# Patient Record
Sex: Female | Born: 1985 | Race: Black or African American | Hispanic: No | Marital: Single | Smoking: Never smoker
Health system: Southern US, Community
[De-identification: ages and names within clinical notes are randomized; demographics above are authoritative.]

## PROBLEM LIST (undated history)

## (undated) DIAGNOSIS — Z803 Family history of malignant neoplasm of breast: Secondary | ICD-10-CM

## (undated) DIAGNOSIS — Z807 Family history of other malignant neoplasms of lymphoid, hematopoietic and related tissues: Secondary | ICD-10-CM

## (undated) DIAGNOSIS — F99 Mental disorder, not otherwise specified: Secondary | ICD-10-CM

## (undated) DIAGNOSIS — B009 Herpesviral infection, unspecified: Secondary | ICD-10-CM

## (undated) DIAGNOSIS — Z808 Family history of malignant neoplasm of other organs or systems: Secondary | ICD-10-CM

## (undated) HISTORY — PX: GALLBLADDER SURGERY: SHX652

## (undated) HISTORY — DX: Family history of malignant neoplasm of breast: Z80.3

## (undated) HISTORY — DX: Mental disorder, not otherwise specified: F99

## (undated) HISTORY — DX: Family history of malignant neoplasm of other organs or systems: Z80.8

## (undated) HISTORY — DX: Family history of other malignant neoplasms of lymphoid, hematopoietic and related tissues: Z80.7

## (undated) HISTORY — PX: WISDOM TOOTH EXTRACTION: SHX21

## (undated) HISTORY — DX: Herpesviral infection, unspecified: B00.9

---

## 2010-07-03 ENCOUNTER — Encounter: Payer: Self-pay | Admitting: Obstetrics and Gynecology

## 2010-11-21 ENCOUNTER — Inpatient Hospital Stay: Payer: Self-pay | Admitting: Obstetrics and Gynecology

## 2013-01-15 ENCOUNTER — Encounter: Payer: Self-pay | Admitting: Maternal and Fetal Medicine

## 2013-06-06 ENCOUNTER — Observation Stay: Payer: Self-pay | Admitting: Obstetrics & Gynecology

## 2015-11-25 DIAGNOSIS — Z8619 Personal history of other infectious and parasitic diseases: Secondary | ICD-10-CM | POA: Insufficient documentation

## 2017-11-19 ENCOUNTER — Emergency Department
Admission: EM | Admit: 2017-11-19 | Discharge: 2017-11-19 | Disposition: A | Discharge status: Home / Self Care | Payer: Self-pay | Attending: Emergency Medicine | Admitting: Emergency Medicine

## 2017-11-19 ENCOUNTER — Other Ambulatory Visit: Payer: Self-pay

## 2017-11-19 ENCOUNTER — Encounter: Payer: Self-pay | Admitting: Emergency Medicine

## 2017-11-19 DIAGNOSIS — Y939 Activity, unspecified: Secondary | ICD-10-CM | POA: Insufficient documentation

## 2017-11-19 DIAGNOSIS — S61412A Laceration without foreign body of left hand, initial encounter: Secondary | ICD-10-CM | POA: Insufficient documentation

## 2017-11-19 DIAGNOSIS — Y999 Unspecified external cause status: Secondary | ICD-10-CM | POA: Insufficient documentation

## 2017-11-19 DIAGNOSIS — Y92090 Kitchen in other non-institutional residence as the place of occurrence of the external cause: Secondary | ICD-10-CM | POA: Insufficient documentation

## 2017-11-19 DIAGNOSIS — W260XXA Contact with knife, initial encounter: Secondary | ICD-10-CM | POA: Insufficient documentation

## 2017-11-19 MED ORDER — LIDOCAINE HCL (PF) 1 % IJ SOLN
5.0000 mL | Freq: Once | INTRAMUSCULAR | Status: DC
  Filled 2017-11-19: qty 5

## 2017-11-19 NOTE — ED Triage Notes (Signed)
Pt with small laceration to left hand with a knife prior to arrival.

## 2017-11-19 NOTE — ED Provider Notes (Signed)
[facility] Emergency Department Provider Note  ____________________________________________   First MD Initiated Contact with Patient 11/19/17 1005     (approximate)  I have reviewed the triage vital signs and the nursing notes.   HISTORY  Chief Complaint Laceration    HPI Adrienne Hall is a 31 y.o. female presents to the emergency room with a laceration to her left hand. Patient states that she was using a knife in the kitchen to cut zip ties when she accidentally cut herself. patient states she is up-to-date on immunizations. He denies any paresthesias to her digits. No foreign body.  History reviewed. No pertinent past medical history.  There are no active problems to display for this patient.   History reviewed. No pertinent surgical history.  Prior to Admission medications   Not on File    Allergies Penicillins  No family history on file.  Social History Social History   Tobacco Use  . Smoking status: Never Smoker  . Smokeless tobacco: Never Used  Substance Use Topics  . Alcohol use: No    Frequency: Never  . Drug use: No    Review of Systems Constitutional: No fever/chills Cardiovascular: Denies chest pain. Respiratory: Denies shortness of breath. Gastrointestinal:  No nausea, no vomiting. Musculoskeletal: negative for left hand pain. Skin: positive for laceration left hand. Neurological: Negative for focal weakness or numbness. ____________________________________________   PHYSICAL EXAM:  VITAL SIGNS: ED Triage Vitals [11/19/17 0907]  Enc Vitals Group     BP 114/68     Pulse Rate 64     Resp 18     Temp 98.5 F (36.9 C)     Temp Source Oral     SpO2 100 %     Weight 160 lb (72.6 kg)     Height 5\' 6"  (1.676 m)     Head Circumference      Peak Flow      Pain Score      Pain Loc      Pain Edu?      Excl. in [place]?    Constitutional: Alert and oriented. Well appearing and in no acute distress. Eyes:  Conjunctivae are normal.  Head: Atraumatic. Neck: No stridor.   Cardiovascular: Normal rate, regular rhythm. Grossly normal heart sounds.  Good peripheral circulation. Respiratory: Normal respiratory effort.  No retractions. Lungs CTAB. Musculoskeletal: exam of left hand there is a superficial laceration noted in the webspace between the first and second digit. There is no active bleeding present. No foreign body noted. The area measures approximately 2 cm. Motor sensory function intact. Neurologic:  Normal speech and language. No gross focal neurologic deficits are appreciated. No gait instability. Skin:  Skin is warm, dry and intact. No rash noted. Psychiatric: Mood and affect are normal. Speech and behavior are normal.  ____________________________________________   LABS (all labs ordered are listed, but only abnormal results are displayed)  Labs Reviewed - No data to display  PROCEDURES  Procedure(s) performed: LACERATION REPAIR Performed by: Johnn Hai Authorized by: Johnn Hai Consent: Verbal consent obtained. Risks and benefits: risks, benefits and alternatives were discussed Consent given by: patient Patient identity confirmed: provided demographic data Prepped and Draped in normal sterile fashion Wound explored  Laceration Location: left hand, web space between first and second digit.  Laceration Length: 2.0cm  No Foreign Bodies seen or palpated  Anesthesia: local infiltration  Local anesthetic: lidocaine 1% without epinephrine  Anesthetic total: 2.0 ml  Irrigation method: syringe Amount  of cleaning: standard  Skin closure: 5-0 Ethilon  Number of sutures:2  Technique: simple interrupted  Patient tolerance: Patient tolerated the procedure well with no immediate complications.  Procedures  Critical Care performed: No  ____________________________________________   INITIAL IMPRESSION / ASSESSMENT AND PLAN / ED COURSE Patient is to take  Tylenol as needed for pain. She is given instructions on care of her laceration. She is return to the ED or go to an urgent care for suture removal in approximately 10-12 days. She was given instructions on watching for signs of infection.  ____________________________________________   FINAL CLINICAL IMPRESSION(S) / ED DIAGNOSES  Final diagnoses:  Laceration of left hand without foreign body, initial encounter     ED Discharge Orders    None       Note:  This document was prepared using Dragon voice recognition software and may include unintentional dictation errors.    Johnn Hai, PA-C 11/19/17 1327    Schuyler Amor, MD 11/19/17 [phone]

## 2017-11-19 NOTE — Discharge Instructions (Signed)
Keeping clean and dry. Clean daily with mild soap and water. Tylenol as needed for pain. Return to the ED or your primary care provider if any signs of infection. Have sutures removed either at your primary care, urgent care, or emergency department in 12 days.

## 2017-11-19 NOTE — ED Notes (Signed)
See triage note  States she was making lunches this am and stabbed herself in web space to left hand  Bleeding controlled

## 2019-01-09 LAB — HM HIV SCREENING LAB: HM HIV Screening: NEGATIVE

## 2019-01-09 LAB — HM PAP SMEAR: HM Pap smear: NEGATIVE

## 2019-04-01 DIAGNOSIS — F418 Other specified anxiety disorders: Secondary | ICD-10-CM | POA: Insufficient documentation

## 2019-06-30 ENCOUNTER — Telehealth: Payer: Self-pay | Admitting: Family Medicine

## 2019-06-30 NOTE — Telephone Encounter (Signed)
Needs nexplanon removed and reinserted

## 2019-07-01 NOTE — Telephone Encounter (Signed)
TC with patient.  Nexplanon appt scheduled for 07/16/19 Aileen Fass, RN

## 2019-07-01 NOTE — Telephone Encounter (Signed)
Nexplanon consult completed. Aileen Fass, RN

## 2019-07-14 DIAGNOSIS — F418 Other specified anxiety disorders: Secondary | ICD-10-CM

## 2019-07-14 DIAGNOSIS — Z8619 Personal history of other infectious and parasitic diseases: Secondary | ICD-10-CM

## 2019-07-16 ENCOUNTER — Ambulatory Visit: Payer: Medicaid Other

## 2019-07-16 ENCOUNTER — Ambulatory Visit (LOCAL_COMMUNITY_HEALTH_CENTER): Payer: Medicaid Other | Admitting: Physician Assistant

## 2019-07-16 ENCOUNTER — Ambulatory Visit: Payer: Self-pay

## 2019-07-16 ENCOUNTER — Other Ambulatory Visit: Payer: Self-pay

## 2019-07-16 VITALS — BP 116/76 | Ht 66.0 in | Wt 188.8 lb

## 2019-07-16 DIAGNOSIS — Z3009 Encounter for other general counseling and advice on contraception: Secondary | ICD-10-CM | POA: Diagnosis not present

## 2019-07-16 DIAGNOSIS — Z113 Encounter for screening for infections with a predominantly sexual mode of transmission: Secondary | ICD-10-CM

## 2019-07-16 LAB — WET PREP FOR TRICH, YEAST, CLUE
Trichomonas Exam: NEGATIVE
Yeast Exam: NEGATIVE

## 2019-07-16 NOTE — Progress Notes (Signed)
Here today for Nexplanon removal/reinsertion and STD testing. Adrienne Morales, RN

## 2019-07-17 ENCOUNTER — Ambulatory Visit: Payer: Self-pay

## 2019-07-17 NOTE — Progress Notes (Signed)
Family Planning Visit- Repeat Yearly Visit  Subjective:  Adrienne Hall is a 33 y.o. being seen today for an well woman visit and to discuss family planning options.    She is currently using Nexplanon for pregnancy prevention. Patient reports she does not  want a pregnancy in the next year. Patient  has Depression with anxiety and History of herpes genitalis on their problem list.  Chief Complaint  Patient presents with  . Contraception    Nexplanon removal/reinsertion    Patient reports that she has no symptoms but still would like STD testing.  Per review of [facility] chart, patient had current Nexplanon placed on 07/03/2017, so it is not due for removal until July of 2021.  Patient denies any symptoms or concerns today.    Does the patient desire a pregnancy in the next year? (OKQ flowsheet)  See flowsheet for other program required questions.   Body mass index is 30.47 kg/m. - Patient is eligible for diabetes screening based on BMI and age >71?  not applicable GG2I ordered? not applicable  Patient reports 1 of partners in last year. Desires STI screening?  Yes  Does the patient have a current or past history of drug use? No   No components found for: HCV]   Health Maintenance Due  Topic Date Due  . TETANUS/TDAP  06/08/2004    Review of Systems  All other systems reviewed and are negative.   The following portions of the patient's history were reviewed and updated as appropriate: allergies, current medications, past family history, past medical history, past social history, past surgical history and problem list. Problem list updated.  Objective:   Vitals:   07/16/19 1553  BP: 116/76  Weight: 188 lb 12.8 oz (85.6 kg)  Height: 5\' 6"  (1.676 m)    Physical Exam Vitals signs reviewed.  Constitutional:      Appearance: Normal appearance.  HENT:     Head: Normocephalic and atraumatic.     Mouth/Throat:     Mouth: Mucous membranes are moist.     Pharynx:  Oropharynx is clear. No oropharyngeal exudate or posterior oropharyngeal erythema.  Neck:     Musculoskeletal: Neck supple.  Pulmonary:     Effort: Pulmonary effort is normal.  Abdominal:     Palpations: Abdomen is soft. There is no mass.     Tenderness: There is no abdominal tenderness. There is no guarding or rebound.  Genitourinary:    General: Normal vulva.     Rectum: Normal.     Comments: External genitalia/pubic area normal female without nits, lice, edema, erythema, lesions or inguinal adenopathy. Vagina with normal mucosa and discharge. Cervix without visible lesions. Uterus normal size, firm, mobile, nt,no masses, no CMT, no adnexal tenderness or fullness.  Lymphadenopathy:     Cervical: No cervical adenopathy.  Skin:    General: Skin is warm and dry.     Findings: No bruising, erythema, lesion or rash.  Neurological:     Mental Status: She is alert and oriented to person, place, and time.  Psychiatric:        Mood and Affect: Mood normal.        Behavior: Behavior normal.        Thought Content: Thought content normal.        Judgment: Judgment normal.       Assessment and Plan:  Adrienne Hall is a 33 y.o. female presenting to the [facility] Department for an initial well woman exam/family  planning visit  Contraception counseling: Reviewed all forms of birth control options available including abstinence; over the counter/barrier methods; hormonal contraceptive medication including pill, patch, ring, injection,contraceptive implant; hormonal and nonhormonal IUDs; permanent sterilization options including vasectomy and the various tubal sterilization modalities. Risks and benefits reviewed.  Questions were answered.  Written information was also given to the patient to review.  Patient desires to continue with Nexplanon, this was prescribed for patient. She will follow up in  3-6 months  for surveillance.  She was told to call with any further questions, or  with any concerns about this method of contraception.  Emphasized use of condoms 100% of the time for STI prevention.  1. Encounter for counseling regarding contraception Counseled that Nexplanon is still good for another year. Rec condoms with all sex for STD protection.  2. Screening for STD (sexually transmitted disease) Patient without symptoms today  Await test results.  Counseled that RN will call if she needs to RTC for any treatment once results are back. - WET PREP FOR [place], [facility], [place] [facility] - Syphilis Serology, [place] [facility]     No follow-ups on file.  No future appointments.  Jerene Dilling, PA

## 2019-09-10 ENCOUNTER — Ambulatory Visit: Payer: Self-pay | Admitting: Physician Assistant

## 2019-09-10 ENCOUNTER — Other Ambulatory Visit: Payer: Self-pay

## 2019-09-10 DIAGNOSIS — Z113 Encounter for screening for infections with a predominantly sexual mode of transmission: Secondary | ICD-10-CM

## 2019-09-11 ENCOUNTER — Encounter: Payer: Self-pay | Admitting: Physician Assistant

## 2019-09-11 LAB — WET PREP FOR TRICH, YEAST, CLUE
Clue Cell Exam: POSITIVE — AB
Trichomonas Exam: NEGATIVE
Yeast Exam: NEGATIVE

## 2019-09-11 NOTE — Progress Notes (Signed)
    STI clinic/screening visit  Subjective:  Adrienne Hall is a 33 y.o. female being seen today for an STI screening visit. The patient reports they do not have symptoms.  Patient has the following medical conditions:   Patient Active Problem List   Diagnosis Date Noted  . Depression with anxiety 04/01/2019  . History of herpes genitalis 11/25/2015     Chief Complaint  Patient presents with  . SEXUALLY TRANSMITTED DISEASE    HPI  Patient reports that she had unprotected sex with a new partner and would like a screening.  Reports that she took prednisone and Azithromycin about 2 weeks ago for strep throat.  No period with the Nexplanon.  Denies any symptoms today.  See flowsheet for further details and programmatic requirements.    The following portions of the patient's history were reviewed and updated as appropriate: allergies, current medications, past medical history, past social history, past surgical history and problem list.  Objective:  There were no vitals filed for this visit.  Physical Exam Constitutional:      General: She is not in acute distress.    Appearance: Normal appearance.  HENT:     Head: Normocephalic and atraumatic.     Mouth/Throat:     Mouth: Mucous membranes are moist.     Pharynx: Oropharynx is clear. No oropharyngeal exudate or posterior oropharyngeal erythema.  Eyes:     Conjunctiva/sclera: Conjunctivae normal.  Neck:     Musculoskeletal: Neck supple.  Pulmonary:     Effort: Pulmonary effort is normal.  Abdominal:     Palpations: Abdomen is soft. There is no mass.     Tenderness: There is no abdominal tenderness. There is no guarding or rebound.  Genitourinary:    General: Normal vulva.     Rectum: Normal.     Comments: External genitalia/pubic area without nits, lice, edema, erythema, lesions and inguinal adenopathy. Vagina with normal mucosa and discharge, pH=4.5. Cervix without visible lesions. Uterus normal size, firm, mobile,  nt, no masses, no CMT, no adnexal tenderness. Lymphadenopathy:     Cervical: No cervical adenopathy.  Skin:    General: Skin is warm and dry.     Findings: No bruising, erythema, lesion or rash.  Neurological:     Mental Status: She is alert and oriented to person, place, and time.  Psychiatric:        Mood and Affect: Mood normal.        Behavior: Behavior normal.        Thought Content: Thought content normal.        Judgment: Judgment normal.       Assessment and Plan:  Adrienne Hall is a 33 y.o. female presenting to the [facility] Department for STI screening  1. Screening for STD (sexually transmitted disease) Patient is without symptoms.  Declines blood work today. Rec condoms with all sex Await test results.  Counseled that RN will call if needs to RTC for any treatment once results are back.  - WET PREP FOR [place], [facility], [place] [facility]     No follow-ups on file.  No future appointments.  Jerene Dilling, PA

## 2019-11-01 ENCOUNTER — Emergency Department
Admission: EM | Admit: 2019-11-01 | Discharge: 2019-11-01 | Disposition: A | Discharge status: Home / Self Care | Payer: Self-pay | Attending: Student | Admitting: Student

## 2019-11-01 ENCOUNTER — Encounter: Payer: Self-pay | Admitting: Intensive Care

## 2019-11-01 ENCOUNTER — Other Ambulatory Visit: Payer: Self-pay

## 2019-11-01 DIAGNOSIS — T1582XA Foreign body in other and multiple parts of external eye, left eye, initial encounter: Secondary | ICD-10-CM | POA: Insufficient documentation

## 2019-11-01 DIAGNOSIS — X58XXXA Exposure to other specified factors, initial encounter: Secondary | ICD-10-CM | POA: Insufficient documentation

## 2019-11-01 DIAGNOSIS — Y9389 Activity, other specified: Secondary | ICD-10-CM | POA: Insufficient documentation

## 2019-11-01 DIAGNOSIS — T1592XA Foreign body on external eye, part unspecified, left eye, initial encounter: Secondary | ICD-10-CM

## 2019-11-01 DIAGNOSIS — Y998 Other external cause status: Secondary | ICD-10-CM | POA: Insufficient documentation

## 2019-11-01 DIAGNOSIS — Z79899 Other long term (current) drug therapy: Secondary | ICD-10-CM | POA: Insufficient documentation

## 2019-11-01 DIAGNOSIS — Y929 Unspecified place or not applicable: Secondary | ICD-10-CM | POA: Insufficient documentation

## 2019-11-01 MED ORDER — FLUORESCEIN SODIUM 1 MG OP STRP
1.0000 | ORAL_STRIP | Freq: Once | OPHTHALMIC | Status: AC
  Administered 2019-11-01: 14:00:00 1 via OPHTHALMIC
  Filled 2019-11-01: qty 1

## 2019-11-01 MED ORDER — ERYTHROMYCIN 5 MG/GM OP OINT: 1.0000 "application " | TOPICAL_OINTMENT | Freq: Four times a day (QID) | OPHTHALMIC | 0 refills | Status: DC

## 2019-11-01 MED ORDER — TETRACAINE HCL 0.5 % OP SOLN
2.0000 [drp] | Freq: Once | OPHTHALMIC | Status: AC
  Administered 2019-11-01: 14:00:00 2 [drp] via OPHTHALMIC
  Filled 2019-11-01: qty 4

## 2019-11-01 NOTE — ED Triage Notes (Signed)
Patient accidentally put a drop of nail glue in her left eye thinking it was eye drops.

## 2019-11-01 NOTE — ED Provider Notes (Signed)
[facility] Emergency Department Provider Note  ____________________________________________  Time seen: Approximately 1:57 PM  I have reviewed the triage vital signs and the nursing notes.   HISTORY  Chief Complaint Foreign Body in Eye (left)    HPI Adrienne Hall is a 33 y.o. female presents emergency department for evaluation after placing nail glue to her left eye today.  Patient thought the nail glue was eyedrops.  She uses eyedrops for allergies.  She does not wear any contacts.  Patient has removed most of the glue prior to coming to the emergency department.  She states that she removed most of her eyelashes in the process of pulling off the glue.  She is now able to open her eye.  She does have a scratchy feeling to her eye.  She is not having any difficulty seeing.   History reviewed. No pertinent past medical history.  Patient Active Problem List   Diagnosis Date Noted  . Depression with anxiety 04/01/2019  . History of herpes genitalis 11/25/2015    Past Surgical History:  Procedure Laterality Date  . CESAREAN SECTION      Prior to Admission medications   Medication Sig Start Date End Date Taking? Authorizing Provider  erythromycin ophthalmic ointment Place 1 application into the right eye 4 (four) times daily. 11/01/19   Laban Emperor, PA-C  etonogestrel (NEXPLANON) 68 MG IMPL implant 1 each by Subdermal route once. 07/03/17   Willaim Sheng, NP  valACYclovir (VALTREX) 500 MG tablet TAKE 1 CAPLET BY MOUTH ONCE DAILY 01/21/19   [provider]    Allergies Penicillins  Family History  Problem Relation Age of Onset  . Diabetes Maternal Grandmother   . Hypertension Maternal Grandmother   . Hyperlipidemia Maternal Grandmother   . Alzheimer's disease Maternal Grandmother   . Heart disease Maternal Grandfather   . Hypertension Maternal Grandfather   . Breast cancer Paternal Grandmother   . Kidney disease Paternal Grandfather    . Sickle cell anemia Half-Brother   . Migraines Half-Brother   . Cancer Half-Brother     Social History Social History   Tobacco Use  . Smoking status: Never Smoker  . Smokeless tobacco: Never Used  Substance Use Topics  . Alcohol use: Yes    Alcohol/week: 1.0 standard drinks    Types: 1 Glasses of wine per week    Frequency: Never  . Drug use: No     Review of Systems  Gastrointestinal: No nausea, no vomiting.  Musculoskeletal: Negative for musculoskeletal pain. Skin: Negative for rash, abrasions, lacerations, ecchymosis. Neurological: Negative for headaches   ____________________________________________   PHYSICAL EXAM:  VITAL SIGNS: ED Triage Vitals  Enc Vitals Group     BP 11/01/19 1305 (!) 121/95     Pulse Rate 11/01/19 1305 73     Resp 11/01/19 1305 14     Temp 11/01/19 1305 98.4 F (36.9 C)     Temp Source 11/01/19 1305 Oral     SpO2 11/01/19 1305 100 %     Weight 11/01/19 1306 180 lb (81.6 kg)     Height 11/01/19 1306 5\' 6"  (1.676 m)     Head Circumference --      Peak Flow --      Pain Score 11/01/19 1306 7     Pain Loc --      Pain Edu? --      Excl. in [place]? --      Constitutional: Alert and oriented. Well appearing and  in no acute distress. Eyes: Left conjunctiva is injected. PERRL. EOMI. Minimal glue to left upper eyelid.  Almost all eyelashes have been removed.  No defect fluorescein stain Head: Atraumatic. ENT:      Ears:      Nose: No congestion/rhinnorhea.      Mouth/Throat: Mucous membranes are moist.  Neck: No stridor.  Cardiovascular: Normal rate, regular rhythm.  Good peripheral circulation. Respiratory: Normal respiratory effort without tachypnea or retractions. Lungs CTAB. Good air entry to the bases with no decreased or absent breath sounds. Musculoskeletal: Full range of motion to all extremities. No gross deformities appreciated. Neurologic:  Normal speech and language. No gross focal neurologic deficits are appreciated.   Skin:  Skin is warm, dry and intact. No rash noted. Psychiatric: Mood and affect are normal. Speech and behavior are normal. Patient exhibits appropriate insight and judgement.   ____________________________________________   LABS (all labs ordered are listed, but only abnormal results are displayed)  Labs Reviewed - No data to display ____________________________________________  EKG   ____________________________________________  RADIOLOGY   No results found.  ____________________________________________    PROCEDURES  Procedure(s) performed:    Procedures    Medications  tetracaine (PONTOCAINE) 0.5 % ophthalmic solution 2 drop (2 drops Left Eye Given 11/01/19 1409)  fluorescein ophthalmic strip 1 strip (1 strip Left Eye Given 11/01/19 1409)     ____________________________________________   INITIAL IMPRESSION / ASSESSMENT AND PLAN / ED COURSE  Pertinent labs & imaging results that were available during my care of the patient were reviewed by me and considered in my medical decision making (see chart for details).  Review of the [place] CSRS was performed in accordance of the [place] prior to dispensing any controlled drugs.     Patient presented to the emergency department for evaluation of nail glue to left eye.  Vital signs and exam are reassuring.  Patient has removed almost all of the nail glue prior to coming to the emergency department.  No visible defect on fluorescein stain.  Patient is not having any difficulty seeing.  Patient will be discharged home with prescriptions for erythromycin ointment. Patient is to follow up with ophthalmology as directed. Patient is given ED precautions to return to the ED for any worsening or new symptoms.  LYRICK VALIS was evaluated in Emergency Department on 11/01/2019 for the symptoms described in the history of present illness. She was evaluated in the context of the global COVID-19 pandemic, which necessitated  consideration that the patient might be at risk for infection with the SARS-CoV-2 virus that causes COVID-19. Institutional protocols and algorithms that pertain to the evaluation of patients at risk for COVID-19 are in a state of rapid change based on information released by regulatory bodies including the CDC and federal and state organizations. These policies and algorithms were followed during the patient's care in the ED.   ____________________________________________  FINAL CLINICAL IMPRESSION(S) / ED DIAGNOSES  Final diagnoses:  Foreign body of left eye, initial encounter      NEW MEDICATIONS STARTED DURING THIS VISIT:  ED Discharge Orders         Ordered    erythromycin ophthalmic ointment  4 times daily     11/01/19 1358              This chart was dictated using voice recognition software/Dragon. Despite best efforts to proofread, errors can occur which can change the meaning. Any change was purely unintentional.    Laban Emperor, PA-C 11/01/19 1701  Lilia Pro., MD 11/02/19 [phone]

## 2019-11-01 NOTE — Discharge Instructions (Addendum)
Please use erythromycin ointment in case you have injured your cornea getting the glue out. Please follow up with ophthalmology next week.

## 2019-11-01 NOTE — ED Notes (Signed)
Pt accidentally glued left eye shut with nail glue - she thought that the nail glue was eye drops

## 2019-11-01 NOTE — ED Notes (Signed)
Signature pad not working - pt unable to sign for discharge - discussed discharge with pt and she verbalized understanding and need for f/u

## 2019-11-24 ENCOUNTER — Other Ambulatory Visit: Payer: Self-pay

## 2019-11-24 ENCOUNTER — Ambulatory Visit: Payer: Self-pay | Admitting: Family Medicine

## 2019-11-24 ENCOUNTER — Encounter: Payer: Self-pay | Admitting: Family Medicine

## 2019-11-24 DIAGNOSIS — Z113 Encounter for screening for infections with a predominantly sexual mode of transmission: Secondary | ICD-10-CM

## 2019-11-24 NOTE — Progress Notes (Signed)
    STI clinic/screening visit  Subjective:  Adrienne Hall is a 33 y.o. female being seen today for an STI screening visit. The patient reports they do not have symptoms.  Patient reports that they do not desire a pregnancy in the next year.   They reported they are not interested in discussing contraception today.   Patient has the following medical conditions:   Patient Active Problem List   Diagnosis Date Noted  . Depression with anxiety 04/01/2019  . History of herpes genitalis 11/25/2015     No chief complaint on file.   HPI  Patient reports here for STD testing, denies symptoms  See flowsheet for further details and programmatic requirements.    The following portions of the patient's history were reviewed and updated as appropriate: allergies, current medications, past medical history, past social history, past surgical history and problem list.  Objective:  There were no vitals filed for this visit.  Physical Exam Neck:     Musculoskeletal: Neck supple.  Lymphadenopathy:     Cervical: No cervical adenopathy.  Skin:    General: Skin is warm and dry.     Findings: No lesion or rash.  Neurological:     Mental Status: She is alert.   Client declined pelvic exam -self collected the GC/chlamydia   Assessment and Plan:  Adrienne Hall is a 33 y.o. female presenting to the [facility] Department for STI screening  1. Screening examination for venereal disease  - Chlamydia/Gonorrhea [place] [facility] - HIV [place] [facility] - Syphilis Serology, [place] [facility]  Client will be notified if tests results are +   No follow-ups on file.  No future appointments.  Hassell Done, FNP

## 2020-01-14 ENCOUNTER — Other Ambulatory Visit: Payer: Self-pay

## 2020-01-14 ENCOUNTER — Encounter: Payer: Self-pay | Admitting: Physician Assistant

## 2020-01-14 ENCOUNTER — Ambulatory Visit: Payer: Self-pay | Admitting: Physician Assistant

## 2020-01-14 DIAGNOSIS — Z113 Encounter for screening for infections with a predominantly sexual mode of transmission: Secondary | ICD-10-CM

## 2020-01-14 LAB — WET PREP FOR TRICH, YEAST, CLUE
Trichomonas Exam: NEGATIVE
Yeast Exam: NEGATIVE

## 2020-01-14 NOTE — Progress Notes (Signed)
Pt here for STD screening.Ronny Bacon, RN

## 2020-01-16 NOTE — Progress Notes (Signed)
[facility] Department STI clinic/screening visit  Subjective:  Adrienne Hall is a 34 y.o. female being seen today for an STI screening visit. The patient reports they do not have symptoms.  Patient reports that they do not desire a pregnancy in the next year.   They reported they are not interested in discussing contraception today.  No LMP recorded. Patient has had an implant.   Patient has the following medical conditions:   Patient Active Problem List   Diagnosis Date Noted  . Depression with anxiety 04/01/2019  . History of herpes genitalis 11/25/2015    Chief Complaint  Patient presents with  . SEXUALLY TRANSMITTED DISEASE    STD Screening    HPI  Patient reports that she thinks that she had a yeast infection and wants to make sure that it has resolved.  States that she had some itching and used an OTC yeast treatment with the symptoms resolving.  Denies other symptoms.  She states that she has a Nexplanon for [facility].  See flowsheet for further details and programmatic requirements.    The following portions of the patient's history were reviewed and updated as appropriate: allergies, current medications, past medical history, past social history, past surgical history and problem list.  Objective:  There were no vitals filed for this visit.  Physical Exam Constitutional:      General: She is not in acute distress.    Appearance: Normal appearance. She is normal weight.  HENT:     Head: Normocephalic and atraumatic.     Comments: No nits, lice, or hair loss. No cervical, supraclavicular or axillary adenopathy.    Mouth/Throat:     Mouth: Mucous membranes are moist.     Pharynx: Oropharynx is clear. No oropharyngeal exudate or posterior oropharyngeal erythema.  Eyes:     Conjunctiva/sclera: Conjunctivae normal.  Pulmonary:     Effort: Pulmonary effort is normal.  Abdominal:     Palpations: Abdomen is soft. There is no mass.     Tenderness: There is no  abdominal tenderness. There is no guarding or rebound.  Genitourinary:    General: Normal vulva.     Rectum: Normal.     Comments: External genitalia/pubic area without nits, lice, edema, erythema, lesions and inguinal adenopathy. Vagina with normal mucosa and discharge, pH=4.5. Cervix without visible lesions. Uterus firm, mobile, nt, no masses, no CMT, no adnexal tenderness or fullness. Musculoskeletal:     Cervical back: Neck supple. No tenderness.  Skin:    General: Skin is warm and dry.     Findings: No bruising, erythema, lesion or rash.  Neurological:     Mental Status: She is alert and oriented to person, place, and time.  Psychiatric:        Mood and Affect: Mood normal.        Behavior: Behavior normal.        Thought Content: Thought content normal.        Judgment: Judgment normal.      Assessment and Plan:  Adrienne Hall is a 34 y.o. female presenting to the [facility] Department for STI screening  1. Screening for STD (sexually transmitted disease) Patient into clinic without symptoms. Rec condoms with all sex. Await test results.  Counseled that RN will call if needs to RTC for treatment once results are back. - WET PREP FOR [place], YEAST, [place] [facility] - Syphilis Serology, [facility]  No follow-ups on file.  No future appointments.  Jerene Dilling, PA

## 2020-01-21 ENCOUNTER — Telehealth: Payer: Self-pay

## 2020-01-21 DIAGNOSIS — A549 Gonococcal infection, unspecified: Secondary | ICD-10-CM

## 2020-01-22 ENCOUNTER — Ambulatory Visit: Payer: Medicaid Other

## 2020-01-22 DIAGNOSIS — A549 Gonococcal infection, unspecified: Secondary | ICD-10-CM

## 2020-01-22 MED ORDER — GENTAMICIN SULFATE 40 MG/ML IJ SOLN
240.0000 mg | Freq: Once | INTRAMUSCULAR | Status: AC
  Administered 2020-01-22: 15:00:00 240 mg via INTRAMUSCULAR

## 2020-01-22 MED ORDER — AZITHROMYCIN 500 MG PO TABS
2000.0000 mg | ORAL_TABLET | Freq: Once | ORAL | Status: AC
  Administered 2020-01-22: 2000 mg via ORAL

## 2020-01-22 NOTE — Progress Notes (Signed)
PCN allergy; tx'd for GC per SO Aileen Fass, RN

## 2020-01-25 NOTE — Telephone Encounter (Signed)
TC to patient. Verified ID via password/SS#. Informed of positive GC and need for tx. Instructed to eat before visit and have partner call for tx appt. Appt scheduled. Romanda Turrubiates, RN   

## 2020-03-10 ENCOUNTER — Other Ambulatory Visit: Payer: Self-pay

## 2020-03-10 ENCOUNTER — Encounter: Payer: Self-pay | Admitting: Family Medicine

## 2020-03-10 ENCOUNTER — Ambulatory Visit (INDEPENDENT_AMBULATORY_CARE_PROVIDER_SITE_OTHER): Payer: 59 | Admitting: Family Medicine

## 2020-03-10 VITALS — BP 102/78 | HR 72 | Temp 97.4°F | Resp 12 | Ht 65.5 in | Wt 194.2 lb

## 2020-03-10 DIAGNOSIS — R0981 Nasal congestion: Secondary | ICD-10-CM | POA: Insufficient documentation

## 2020-03-10 DIAGNOSIS — Z23 Encounter for immunization: Secondary | ICD-10-CM | POA: Diagnosis not present

## 2020-03-10 DIAGNOSIS — Z8619 Personal history of other infectious and parasitic diseases: Secondary | ICD-10-CM | POA: Diagnosis not present

## 2020-03-10 DIAGNOSIS — H538 Other visual disturbances: Secondary | ICD-10-CM | POA: Insufficient documentation

## 2020-03-10 MED ORDER — VALACYCLOVIR HCL 500 MG PO TABS: 500.0000 mg | ORAL_TABLET | Freq: Every day | ORAL | 3 refills | Status: DC

## 2020-03-10 NOTE — Progress Notes (Signed)
Subjective:     Adrienne Hall is a 34 y.o. female presenting for Establish Care (moved from [place], and then has been going to [facility]), Sinus Problem (nasal congestion-uses a lot of Flonase spray), and Referral (needs to see opthalmologist, glued her eye lids shut around December 2020)     HPI  # Left leg pain - dull ache on a daily basis after getting a shot of antibiotics - the week it happened could barely walk on the leg - needle pain up and down the leg  #Sinus issues - symptoms x 3 years - nose gets stuffy - has tried zyrtec/claritin/allegra, Flonase, xyzal w/o improvement with the congestion - no cough, sneezing  - using decongestant sprays w/ improvement - has tried neti pot w/ improvement - saline rinse helped for a short period of time -- woke up not being able breath  Endorses random nausea - not associated with eating or drinking     #blurry/fuzzy vision - left blurry visiont - glued her eyelids shut in December - did not go to an optometrist - accidentally grabbed nail glue and thought it was eye drops and ended up gluing her eyelid shut - given erythromycin ointment and told to get eyepatch  Review of Systems   Social History   Tobacco Use  Smoking Status Never Smoker  Smokeless Tobacco Never Used        Objective:    BP Readings from Last 3 Encounters:  03/10/20 102/78  11/01/19 (!) 121/95  07/16/19 116/76   Wt Readings from Last 3 Encounters:  03/10/20 194 lb 4 oz (88.1 kg)  11/01/19 180 lb (81.6 kg)  07/16/19 188 lb 12.8 oz (85.6 kg)    BP 102/78   Pulse 72   Temp (!) 97.4 F (36.3 C)   Resp 12   Ht 5' 5.5" (1.664 m)   Wt 194 lb 4 oz (88.1 kg)   SpO2 98%   BMI 31.83 kg/m    Physical Exam Constitutional:      General: She is not in acute distress.    Appearance: She is well-developed. She is not diaphoretic.  HENT:     Head: Normocephalic and atraumatic.     Right Ear: Tympanic membrane and ear canal normal.   Left Ear: Tympanic membrane and ear canal normal.     Nose: Mucosal edema and rhinorrhea present.     Right Sinus: No maxillary sinus tenderness or frontal sinus tenderness.     Left Sinus: No maxillary sinus tenderness or frontal sinus tenderness.     Mouth/Throat:     Pharynx: Uvula midline. Posterior oropharyngeal erythema present. No oropharyngeal exudate.     Tonsils: 0 on the right. 0 on the left.  Eyes:     General: No scleral icterus.    Conjunctiva/sclera: Conjunctivae normal.  Cardiovascular:     Rate and Rhythm: Normal rate and regular rhythm.     Heart sounds: Normal heart sounds. No murmur.  Pulmonary:     Effort: Pulmonary effort is normal. No respiratory distress.     Breath sounds: Normal breath sounds.  Musculoskeletal:     Cervical back: Neck supple.  Lymphadenopathy:     Cervical: No cervical adenopathy.  Skin:    General: Skin is warm and dry.     Capillary Refill: Capillary refill takes less than 2 seconds.  Neurological:     Mental Status: She is alert.           Assessment &  Plan:   Problem List Items Addressed This Visit      Respiratory   Nasal sinus congestion    Stop afrin and other nose spray. Trial of Saline rinse and flonse. If not improved will consider course of abx or ENT referral        Other   History of herpes genitalis    Continue valacyclovir      Relevant Medications   valACYclovir (VALTREX) 500 MG tablet   Blurry vision, left eye - Primary    After exposure to glue. Referral to ophthalmology for evaluation      Relevant Orders   Ambulatory referral to Ophthalmology    Other Visit Diagnoses    Need for immunization against influenza       Relevant Orders   Flu Vaccine QUAD 6+ mos PF IM (Fluarix Quad PF) (Completed)       Return in about 4 weeks (around 04/07/2020).  Lesleigh Noe, MD

## 2020-03-10 NOTE — Assessment & Plan Note (Signed)
After exposure to glue. Referral to ophthalmology for evaluation

## 2020-03-10 NOTE — Assessment & Plan Note (Signed)
Continue valacyclovir 

## 2020-03-10 NOTE — Patient Instructions (Signed)
#  Sinus issues - Try a saline rinse (neti pot) - 1-2 times a day - do this for 2 weeks  - if no improvement could do trial of antibiotics   Can try Flonase or Fluticasone -- nose spray   Vision issues - referral

## 2020-03-10 NOTE — Assessment & Plan Note (Signed)
Stop afrin and other nose spray. Trial of Saline rinse and flonse. If not improved will consider course of abx or ENT referral

## 2020-03-30 ENCOUNTER — Other Ambulatory Visit: Payer: Self-pay

## 2020-03-30 ENCOUNTER — Ambulatory Visit: Payer: Self-pay | Admitting: Physician Assistant

## 2020-03-30 DIAGNOSIS — Z202 Contact with and (suspected) exposure to infections with a predominantly sexual mode of transmission: Secondary | ICD-10-CM

## 2020-03-30 DIAGNOSIS — Z113 Encounter for screening for infections with a predominantly sexual mode of transmission: Secondary | ICD-10-CM

## 2020-03-30 MED ORDER — GENTAMICIN SULFATE 40 MG/ML IJ SOLN
240.0000 mg | Freq: Once | INTRAMUSCULAR | Status: AC
  Administered 2020-03-30: 240 mg via INTRAMUSCULAR

## 2020-03-30 MED ORDER — AZITHROMYCIN 500 MG PO TABS
2000.0000 mg | ORAL_TABLET | Freq: Once | ORAL | Status: AC
  Administered 2020-03-30: 2000 mg via ORAL

## 2020-03-30 NOTE — Progress Notes (Signed)
S:  Patient into clinic requesting treatment as a contact to Gonorrhea. States that she was treated about 1 month ago for the same thing and partner had told her that he had been treated but after they had sex about 2 weeks ago admitted that he had not been treated.  Declines screening exam/testing and requests treatment only.  Allergic to Penicillin. O:  WDWN female in NAD, A&O x 3, pleasant and cooperative. A/P:  1.  Patient needs treatment as a contact to [facility]. 2.  Patient given Gentamicin 240mg  IM and Azithromycin 2 g po DOT today.   Reviewed with patient normal SE of medicine and when to call clinic. 3.  No sex for 7 days and until after partner completes treatment. 4.  Rec condoms with all sex. 5.  RTC prn and if vomits < 2 hr after taking medicine for re-treatment. 6.  Call with questions or concerns.

## 2020-04-01 ENCOUNTER — Encounter: Payer: Self-pay | Admitting: Physician Assistant

## 2020-08-18 ENCOUNTER — Other Ambulatory Visit: Payer: Self-pay

## 2020-08-18 ENCOUNTER — Ambulatory Visit (LOCAL_COMMUNITY_HEALTH_CENTER): Payer: No Typology Code available for payment source | Admitting: Physician Assistant

## 2020-08-18 ENCOUNTER — Ambulatory Visit: Payer: Medicaid Other

## 2020-08-18 VITALS — BP 113/77 | Ht 65.0 in | Wt 192.2 lb

## 2020-08-18 DIAGNOSIS — Z30017 Encounter for initial prescription of implantable subdermal contraceptive: Secondary | ICD-10-CM

## 2020-08-18 DIAGNOSIS — Z3009 Encounter for other general counseling and advice on contraception: Secondary | ICD-10-CM | POA: Diagnosis not present

## 2020-08-18 DIAGNOSIS — Z3046 Encounter for surveillance of implantable subdermal contraceptive: Secondary | ICD-10-CM

## 2020-08-18 MED ORDER — ETONOGESTREL 68 MG ~~LOC~~ IMPL
68.0000 mg | DRUG_IMPLANT | Freq: Once | SUBCUTANEOUS | Status: AC
  Administered 2020-08-18: 68 mg via SUBCUTANEOUS

## 2020-08-18 NOTE — Progress Notes (Signed)
In today for nexplanon removal, reinsertion. Consents for removal, reinsertion signed. Instructions given. Will reschedule PE.  Barkley Boards, RN

## 2020-08-18 NOTE — Progress Notes (Signed)
S: 34 yo presents for Nexplanon replacement. Current Nexplanon placed 07/03/17. No problems with it.  O: overweight woman in NAD. 4cm rod deep but palpable in L bicipital groove.  A/P: Nexplanon Removal and Insertion  Patient identified, informed consent performed, consent signed.   Patient does understand that irregular bleeding is a very common side effect of this medication. She was advised to have backup contraception for one week after replacement of the implant. Patient deemed to meet WHO criteria for being reasonably certain she is not pregnant.  Appropriate time out taken. Nexplanon site identified. Area prepped in usual sterile fashon. 3 ml of 1% lidocaine with epinephrine was used to anesthetize the area at the distal end of the implant. A small stab incision was made right beside the implant on the distal portion. The Nexplanon rod was grasped using hemostats and removed without difficulty. There was minimal blood loss. There were no complications.   Confirmed correct location of insertion site. The insertion site was identified 8-10 cm (3-4 inches) from the medial epicondyle of the humerus and 3-5 cm (1.25-2 inches) posterior to (below) the sulcus (groove) between the biceps and triceps muscles of the patient's L arm. New Nexplanon removed from packaging, Device confirmed in needle, then inserted full length of needle and withdrawn per handbook instructions. Nexplanon was able to palpated in the patient's L arm; patient palpated the insert herself.  There was minimal blood loss. Patient insertion site covered with guaze and a pressure bandage to reduce any bruising. The patient tolerated the procedure well and was given post procedure instructions.    Enc pt to RTC in the next month for routine well-woman exam.

## 2021-04-13 ENCOUNTER — Other Ambulatory Visit: Payer: Self-pay

## 2021-04-13 ENCOUNTER — Encounter: Payer: Self-pay | Admitting: Family Medicine

## 2021-04-13 ENCOUNTER — Ambulatory Visit (INDEPENDENT_AMBULATORY_CARE_PROVIDER_SITE_OTHER): Payer: No Typology Code available for payment source | Admitting: Family Medicine

## 2021-04-13 VITALS — BP 108/78 | HR 61 | Temp 98.0°F | Ht 66.0 in | Wt 193.8 lb

## 2021-04-13 DIAGNOSIS — E66811 Obesity, class 1: Secondary | ICD-10-CM

## 2021-04-13 DIAGNOSIS — D229 Melanocytic nevi, unspecified: Secondary | ICD-10-CM

## 2021-04-13 DIAGNOSIS — Z0000 Encounter for general adult medical examination without abnormal findings: Secondary | ICD-10-CM

## 2021-04-13 DIAGNOSIS — Z136 Encounter for screening for cardiovascular disorders: Secondary | ICD-10-CM

## 2021-04-13 DIAGNOSIS — Z1159 Encounter for screening for other viral diseases: Secondary | ICD-10-CM

## 2021-04-13 DIAGNOSIS — Z803 Family history of malignant neoplasm of breast: Secondary | ICD-10-CM

## 2021-04-13 DIAGNOSIS — Z809 Family history of malignant neoplasm, unspecified: Secondary | ICD-10-CM

## 2021-04-13 DIAGNOSIS — E669 Obesity, unspecified: Secondary | ICD-10-CM | POA: Insufficient documentation

## 2021-04-13 DIAGNOSIS — Z808 Family history of malignant neoplasm of other organs or systems: Secondary | ICD-10-CM

## 2021-04-13 HISTORY — DX: Family history of malignant neoplasm, unspecified: Z80.9

## 2021-04-13 LAB — LIPID PANEL
Cholesterol: 175 mg/dL (ref 0–200)
HDL: 54.4 mg/dL (ref >39.00)
LDL Cholesterol: 108 mg/dL — ABNORMAL HIGH (ref 0–99)
NonHDL: 120.44
Total CHOL/HDL Ratio: 3
Triglycerides: 63 mg/dL (ref 0.0–149.0)
VLDL: 12.6 mg/dL (ref 0.0–40.0)

## 2021-04-13 LAB — COMPREHENSIVE METABOLIC PANEL
ALT: 13 U/L (ref 0–35)
AST: 10 U/L (ref 0–37)
Albumin: 4.2 g/dL (ref 3.5–5.2)
Alkaline Phosphatase: 45 U/L (ref 39–117)
BUN: 10 mg/dL (ref 6–23)
CO2: 29 mEq/L (ref 19–32)
Calcium: 9.8 mg/dL (ref 8.4–10.5)
Chloride: 103 mEq/L (ref 96–112)
Creatinine, Ser: 1.05 mg/dL (ref 0.40–1.20)
GFR: 68.68 mL/min (ref >60.00)
Glucose, Bld: 95 mg/dL (ref 70–99)
Potassium: 4.3 mEq/L (ref 3.5–5.1)
Sodium: 138 mEq/L (ref 135–145)
Total Bilirubin: 0.6 mg/dL (ref 0.2–1.2)
Total Protein: 7.4 g/dL (ref 6.0–8.3)

## 2021-04-13 LAB — TSH: TSH: 1.01 u[IU]/mL (ref 0.35–4.50)

## 2021-04-13 NOTE — Progress Notes (Signed)
Subjective:     Adrienne Hall is a 35 y.o. female presenting for Nevus (Have recently appeared on body. With family hx of melanoma and myeloma) and Referral (Dermatologist in [place] )     HPI  #moles - family hx of melanoma  - brother with melanoma  - put necklace on 4/17 and noticed it for the first time - also found a spot on the leg and under the breast - not sure how long the ones on her arms have been present  Exercising at the gym 5 am every day Avoiding fried foods as much as possible  Review of Systems   Social History   Tobacco Use  Smoking Status Never Smoker  Smokeless Tobacco Never Used        Objective:    BP Readings from Last 3 Encounters:  04/13/21 108/78  08/18/20 113/77  03/10/20 102/78   Wt Readings from Last 3 Encounters:  04/13/21 193 lb 12 oz (87.9 kg)  08/18/20 192 lb 3.2 oz (87.2 kg)  03/10/20 194 lb 4 oz (88.1 kg)    BP 108/78   Pulse 61   Temp 98 F (36.7 C) (Temporal)   Ht 5\' 6"  (1.676 m)   Wt 193 lb 12 oz (87.9 kg)   SpO2 100%   BMI 31.27 kg/m    Physical Exam Constitutional:      General: She is not in acute distress.    Appearance: She is well-developed. She is not diaphoretic.  HENT:     Head: Normocephalic and atraumatic.     Right Ear: External ear normal.     Left Ear: External ear normal.     Nose: Nose normal.  Eyes:     General: No scleral icterus.    Conjunctiva/sclera: Conjunctivae normal.  Cardiovascular:     Rate and Rhythm: Normal rate and regular rhythm.     Heart sounds: No murmur heard.   Pulmonary:     Effort: Pulmonary effort is normal. No respiratory distress.     Breath sounds: Normal breath sounds. No wheezing.  Abdominal:     General: Bowel sounds are normal. There is no distension.     Palpations: Abdomen is soft. There is no mass.     Tenderness: There is no abdominal tenderness. There is no guarding or rebound.  Musculoskeletal:        General: Normal range of motion.      Cervical back: Neck supple.  Lymphadenopathy:     Cervical: No cervical adenopathy.  Skin:    General: Skin is warm and dry.     Capillary Refill: Capillary refill takes less than 2 seconds.     Comments: Left neck with brown flat nevi normal appearance and small skin tag.   Right lateral lower leg with small nevi with asymmetry with one area of dark brown/black and another area of light brown. <32mm  Neurological:     Mental Status: She is alert and oriented to person, place, and time.     Deep Tendon Reflexes: Reflexes normal.  Psychiatric:        Behavior: Behavior normal.           Assessment & Plan:   Problem List Items Addressed This Visit      Other   Obesity (BMI 30.0-34.9)   Relevant Orders   Comprehensive metabolic panel   TSH    Other Visit Diagnoses    Annual physical exam    -  Primary  Encounter for special screening examination for cardiovascular disorder       Relevant Orders   Lipid panel   Need for hepatitis C screening test       Relevant Orders   Hepatitis C antibody   Family history of cancer in brother       Relevant Orders   Ambulatory referral to Genetics   Family history of skin cancer       Relevant Orders   Ambulatory referral to Genetics   Family history of breast cancer       Relevant Orders   Ambulatory referral to Genetics   Family history of brain cancer       Relevant Orders   Ambulatory referral to Genetics   Atypical mole       Relevant Orders   Ambulatory referral to Dermatology     Discussed healthy diet exercise and routine care.   Not due for any cancer screening but will screen for HLD and glucose    Return in about 1 year (around 04/13/2022).  Lesleigh Noe, MD  This visit occurred during the SARS-CoV-2 public health emergency.  Safety protocols were in place, including screening questions prior to the visit, additional usage of staff PPE, and extensive cleaning of exam room while observing appropriate contact  time as indicated for disinfecting solutions.

## 2021-04-13 NOTE — Patient Instructions (Signed)
Preventive Care 21-36 Years Old, Female Preventive care refers to lifestyle choices and visits with your health care provider that can promote health and wellness. This includes:  A yearly physical exam. This is also called an annual wellness visit.  Regular dental and eye exams.  Immunizations.  Screening for certain conditions.  Healthy lifestyle choices, such as: ? Eating a healthy diet. ? Getting regular exercise. ? Not using drugs or products that contain nicotine and tobacco. ? Limiting alcohol use. What can I expect for my preventive care visit? Physical exam Your health care provider may check your:  Height and weight. These may be used to calculate your BMI (body mass index). BMI is a measurement that tells if you are at a healthy weight.  Heart rate and blood pressure.  Body temperature.  Skin for abnormal spots. Counseling Your health care provider may ask you questions about your:  Past medical problems.  Family's medical history.  Alcohol, tobacco, and drug use.  Emotional well-being.  Home life and relationship well-being.  Sexual activity.  Diet, exercise, and sleep habits.  Work and work environment.  Access to firearms.  Method of birth control.  Menstrual cycle.  Pregnancy history. What immunizations do I need? Vaccines are usually given at various ages, according to a schedule. Your health care provider will recommend vaccines for you based on your age, medical history, and lifestyle or other factors, such as travel or where you work.   What tests do I need? Blood tests  Lipid and cholesterol levels. These may be checked every 5 years starting at age 20.  Hepatitis C test.  Hepatitis B test. Screening  Diabetes screening. This is done by checking your blood sugar (glucose) after you have not eaten for a while (fasting).  STD (sexually transmitted disease) testing, if you are at risk.  BRCA-related cancer screening. This may be  done if you have a family history of breast, ovarian, tubal, or peritoneal cancers.  Pelvic exam and Pap test. This may be done every 3 years starting at age 21. Starting at age 30, this may be done every 5 years if you have a Pap test in combination with an HPV test. Talk with your health care provider about your test results, treatment options, and if necessary, the need for more tests.   Follow these instructions at home: Eating and drinking  Eat a healthy diet that includes fresh fruits and vegetables, whole grains, lean protein, and low-fat dairy products.  Take vitamin and mineral supplements as recommended by your health care provider.  Do not drink alcohol if: ? Your health care provider tells you not to drink. ? You are pregnant, may be pregnant, or are planning to become pregnant.  If you drink alcohol: ? Limit how much you have to 0-1 drink a day. ? Be aware of how much alcohol is in your drink. In the [place] one drink equals one 12 oz bottle of beer (355 mL), one 5 oz glass of wine (148 mL), or one 1 oz glass of hard liquor (44 mL).   Lifestyle  Take daily care of your teeth and gums. Brush your teeth every morning and night with fluoride toothpaste. Floss one time each day.  Stay active. Exercise for at least 30 minutes 5 or more days each week.  Do not use any products that contain nicotine or tobacco, such as cigarettes, e-cigarettes, and chewing tobacco. If you need help quitting, ask your health care provider.  Do not   use drugs.  If you are sexually active, practice safe sex. Use a condom or other form of protection to prevent STIs (sexually transmitted infections).  If you do not wish to become pregnant, use a form of birth control. If you plan to become pregnant, see your health care provider for a prepregnancy visit.  Find healthy ways to cope with stress, such as: ? Meditation, yoga, or listening to music. ? Journaling. ? Talking to a trusted  person. ? Spending time with friends and family. Safety  Always wear your seat belt while driving or riding in a vehicle.  Do not drive: ? If you have been drinking alcohol. Do not ride with someone who has been drinking. ? When you are tired or distracted. ? While texting.  Wear a helmet and other protective equipment during sports activities.  If you have firearms in your house, make sure you follow all gun safety procedures.  Seek help if you have been physically or sexually abused. What's next?  Go to your health care provider once a year for an annual wellness visit.  Ask your health care provider how often you should have your eyes and teeth checked.  Stay up to date on all vaccines. This information is not intended to replace advice given to you by your health care provider. Make sure you discuss any questions you have with your health care provider. Document Revised: 08/07/2020 Document Reviewed: 08/21/2018 Elsevier Patient Education  2021 [employer] Inc.  

## 2021-04-13 NOTE — Assessment & Plan Note (Signed)
New mole of uncertain time length with some asymmetry and fmhx of melanoma in first degree relative. Will have her see dermatology for complete skin check and evaluation.

## 2021-04-13 NOTE — Assessment & Plan Note (Signed)
Family hx of multiple cancers. Discussed option of genetic counseling as unsure if any family have done this. Not sure if she is a candidate but will refer for further evaluation and discussion.

## 2021-04-14 LAB — HEPATITIS C ANTIBODY
Hepatitis C Ab: NONREACTIVE
SIGNAL TO CUT-OFF: 0.01 (ref <1.00)

## 2021-05-03 ENCOUNTER — Encounter: Payer: Self-pay | Admitting: Licensed Clinical Social Worker

## 2021-05-03 ENCOUNTER — Inpatient Hospital Stay: Payer: No Typology Code available for payment source

## 2021-05-03 ENCOUNTER — Other Ambulatory Visit: Payer: Self-pay

## 2021-05-03 ENCOUNTER — Inpatient Hospital Stay: Payer: No Typology Code available for payment source | Attending: Oncology | Admitting: Licensed Clinical Social Worker

## 2021-05-03 DIAGNOSIS — Z803 Family history of malignant neoplasm of breast: Secondary | ICD-10-CM

## 2021-05-03 DIAGNOSIS — Z807 Family history of other malignant neoplasms of lymphoid, hematopoietic and related tissues: Secondary | ICD-10-CM | POA: Insufficient documentation

## 2021-05-03 DIAGNOSIS — Z808 Family history of malignant neoplasm of other organs or systems: Secondary | ICD-10-CM | POA: Diagnosis not present

## 2021-05-03 NOTE — Progress Notes (Signed)
REFERRING PROVIDER: Lesleigh Noe, MD [address],  [place] [address]  PRIMARY PROVIDER:  Lesleigh Noe, MD  PRIMARY REASON FOR VISIT:  1. Family history of melanoma   2. Family history of breast cancer   3. Family history of multiple myeloma      HISTORY OF PRESENT ILLNESS:   Adrienne Hall, a 35 y.o. female, was seen for a [place] cancer genetics consultation at the request of Dr. Einar Pheasant due to a family history of cancer.  Adrienne Hall presents to clinic today to discuss the possibility of a hereditary predisposition to cancer, genetic testing, and to further clarify her future cancer risks, as well as potential cancer risks for family members.    Adrienne Hall is a 35 y.o. female with no personal history of cancer.    CANCER HISTORY:  Oncology History   No history exists.     RISK FACTORS:  Menarche was at age 7.  First live birth at age 3.  OCP use for approximately 9 years.  Ovaries intact: yes.  Hysterectomy: no.  Menopausal status: premenopausal.  HRT use: 0 years. Colonoscopy: no; not examined. Mammogram within the last year: no. Number of breast biopsies: 0. Up to date with pelvic exams: yes.   Past Medical History:  Diagnosis Date  . Family history of breast cancer   . Family history of melanoma   . Family history of multiple myeloma   . Herpes simplex   . Mental disorder    "Borderline Personality Disorder"    Past Surgical History:  Procedure Laterality Date  . CESAREAN SECTION    . WISDOM TOOTH EXTRACTION      Social History   Socioeconomic History  . Marital status: Single    Spouse name: Not on file  . Number of children: 2  . Years of education: college  . Highest education level: Not on file  Occupational History  . Occupation: [employer]  Tobacco Use  . Smoking status: Never Smoker  . Smokeless tobacco: Never Used  Vaping Use  . Vaping Use: Never used  Substance and Sexual Activity  . Alcohol use: Yes    Alcohol/week: 1.0  standard drink    Types: 1 Glasses of wine per week    Comment: once a week, glass of wine  . Drug use: No  . Sexual activity: Yes    Partners: Male    Birth control/protection: Implant, Condom  Other Topics Concern  . 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  Significant diagnoses are listed below: Family History  Problem Relation Age of Onset  .  Diabetes Maternal Grandmother   . Hypertension Maternal Grandmother   . Hyperlipidemia Maternal Grandmother   . Alzheimer's disease Maternal Grandmother   . Stroke Maternal Grandmother 80  . Heart disease Maternal Grandfather   . Hypertension Maternal Grandfather   . Breast cancer Paternal Grandmother 23  . Kidney disease Paternal Grandfather   . Healthy Mother   . Healthy Father   . Diabetes Brother        associated with cancer  . Melanoma Brother   . Other Brother        soft tissue sarcoma; melanoma  . Hypertension Brother   . Hyperlipidemia Brother   . Other Brother        hx brain tumor, was removed  . Multiple sclerosis Brother   . Multiple myeloma Maternal Aunt   . Epilepsy Maternal Aunt   . Breast cancer Maternal Great-grandmother 30   Adrienne Hall has 2 daughters, 82 and 7. She has 2 brothers. One of her brothers was diagnosed with soft tissue  sarcoma at 53 and melanoma at 69, he is being treated for this at the [place]. Her other brother had a benign brain tumor removed.   Adrienne Hall mother is living in her 18s. Patient had 5 maternal aunts, 3 uncles. One aunt recently passed from multiple myeloma at 56. No known cancers in maternal cousins. Maternal grandmother passed of Alzheimer's at 62 and had a sister who possibly had cancer. Her mother, patient's great grandmother, had breast cancer at 35. Patient's maternal grandfather died at 8.   Adrienne Hall father is living in his 13s. Patient has 1 paternal aunt, 1 paternal uncle, neither have had cancer. Paternal grandmother had breast cancer in her 18s and passed from it, grandfather died in his 68s-60s.  Adrienne Hall is unaware of previous family history of genetic testing for hereditary cancer risks. Patient's ancestors are of [place], [place], [place], Native [place], [place] descent. There is no reported Ashkenazi Jewish ancestry. There is no known consanguinity.    GENETIC COUNSELING ASSESSMENT: Adrienne Hall is a 35 y.o. female with a family history which is somewhat suggestive of a hereditary cancer syndrome and predisposition to cancer. We, therefore, discussed and recommended the following at today's visit.   DISCUSSION: We discussed that approximately 5-10% of cancer cancer is hereditary.  Most cases of hereditary breast cancer are associated with BRCA1/BRCA2 genes, although there are other genes associated with hereditary breast cancer as well including TP53 Nicoletta Dress Fraumeni syndrome), which can also be associated with sarcoma and brain tumors.  We discussed that testing is beneficial for several reasons includingknowing about other cancer risks, identifying potential screening and risk-reduction options that may be appropriate, and to understand if other family members could be at risk for cancer and allow them to undergo genetic testing.   We reviewed the characteristics, features and  inheritance patterns of hereditary cancer syndromes. We also discussed genetic testing, including the appropriate family members to test, the process of testing, insurance coverage and turn-around-time for results. We discussed the implications of a negative, positive and/or variant of uncertain significant result. We recommended Adrienne Hall pursue genetic testing for the [facility] Multi-Cancer+RNA gene panel.   The Multi-Cancer Panel + RNA offered by [facility] includes sequencing and/or deletion duplication testing of the following 84 genes: AIP, ALK, APC, ATM, AXIN2,BAP1,  BARD1, BLM, BMPR1A, BRCA1, BRCA2, BRIP1, CASR, CDC73, CDH1, CDK4, CDKN1B, CDKN1C, CDKN2A (p14ARF), CDKN2A (p16INK4a), CEBPA, CHEK2, CTNNA1, DICER1, DIS3L2, EGFR (c.2369C>T, p.Thr790Met variant only), EPCAM (Deletion/duplication testing only),  FH, FLCN, GATA2, GPC3, GREM1 (Promoter region deletion/duplication testing only), HOXB13 (c.251G>A, p.Gly84Glu), HRAS, KIT, MAX, MEN1, MET, MITF (c.952G>A, p.Glu318Lys variant only), MLH1, MSH2, MSH3, MSH6, MUTYH, NBN, NF1, NF2, NTHL1, PALB2, PDGFRA, PHOX2B, PMS2, POLD1, POLE, POT1, PRKAR1A, PTCH1, PTEN, RAD50, RAD51C, RAD51D, RB1, RECQL4, RET, RUNX1, SDHAF2, SDHA (sequence changes only), SDHB, SDHC, SDHD, SMAD4, SMARCA4, SMARCB1, SMARCE1, STK11, SUFU, TERC, TERT, TMEM127, TP53, TSC1, TSC2, VHL, WRN and WT1.  Based on Adrienne Hall's family history of cancer, she meets medical criteria for genetic testing. Despite that she meets criteria, she may still have an out of pocket cost. We discussed that if her out of pocket cost for testing is over $100, the laboratory will call and confirm whether she wants to proceed with testing.  If the out of pocket cost of testing is less than $100 she will be billed by the genetic testing laboratory.   PLAN: After considering the risks, benefits, and limitations, Adrienne Hall provided informed consent to pursue genetic testing and the blood sample was sent to [facility]  for analysis of the Multi-Cancer Panel+RNA. Results should be available within approximately 2-3 weeks' time, at which point they will be disclosed by telephone to Adrienne Hall, as will any additional recommendations warranted by these results. Adrienne Hall will receive a summary of her genetic counseling visit and a copy of her results once available. This information will also be available in Epic.   Adrienne Hall questions were answered to her satisfaction today. Our contact information was provided should additional questions or concerns arise. Thank you for the referral and allowing [place] to share in the care of your patient.   Faith Rogue, MS, [facility] Genetic Counselor [email or web address] Phone: [phone]  The patient was seen for a total of 30 minutes in face-to-face genetic counseling.  Dr. Grayland Ormond was available for discussion regarding this case.   _______________________________________________________________________ For Office Staff:  Number of people involved in session: 1 Was an Intern/ student involved with case: no

## 2021-05-24 ENCOUNTER — Telehealth: Payer: Self-pay | Admitting: Licensed Clinical Social Worker

## 2021-05-24 ENCOUNTER — Ambulatory Visit: Payer: Self-pay | Admitting: Licensed Clinical Social Worker

## 2021-05-24 ENCOUNTER — Encounter: Payer: Self-pay | Admitting: Licensed Clinical Social Worker

## 2021-05-24 DIAGNOSIS — Z808 Family history of malignant neoplasm of other organs or systems: Secondary | ICD-10-CM

## 2021-05-24 DIAGNOSIS — Z807 Family history of other malignant neoplasms of lymphoid, hematopoietic and related tissues: Secondary | ICD-10-CM

## 2021-05-24 DIAGNOSIS — Z1379 Encounter for other screening for genetic and chromosomal anomalies: Secondary | ICD-10-CM | POA: Insufficient documentation

## 2021-05-24 DIAGNOSIS — Z803 Family history of malignant neoplasm of breast: Secondary | ICD-10-CM

## 2021-05-24 NOTE — Telephone Encounter (Signed)
Revealed negative genetic testing.  Revealed that a VUS in POLD1 was identified. This normal result is reassuring. It is unlikely that there is an increased risk of cancer due to a mutation in one of these genes.  However, genetic testing is not perfect, and cannot definitively rule out a hereditary cause.  It will be important for her to keep in contact with genetics to learn if any additional testing may be needed in the future.

## 2021-05-24 NOTE — Progress Notes (Signed)
HPI:  Adrienne Hall was previously seen in the [facility] clinic due to a family history of cancer and concerns regarding a hereditary predisposition to cancer. Please refer to our prior cancer genetics clinic note for more information regarding our discussion, assessment and recommendations, at the time. Adrienne Hall recent genetic test results were disclosed to her, as were recommendations warranted by these results. These results and recommendations are discussed in more detail below.  CANCER HISTORY:  Oncology History   No history exists.    FAMILY HISTORY:  We obtained a detailed, 4-generation family history.  Significant diagnoses are listed below: Family History  Problem Relation Age of Onset  . Diabetes Maternal Grandmother   . Hypertension Maternal Grandmother   . Hyperlipidemia Maternal Grandmother   . Alzheimer's disease Maternal Grandmother   . Stroke Maternal Grandmother 80  . Heart disease Maternal Grandfather   . Hypertension Maternal Grandfather   . Breast cancer Paternal Grandmother 31  . Kidney disease Paternal Grandfather   . Healthy Mother   . Healthy Father   . Diabetes Brother        associated with cancer  . Melanoma Brother   . Other Brother        soft tissue sarcoma; melanoma  . Hypertension Brother   . Hyperlipidemia Brother   . Other Brother        hx brain tumor, was removed  . Multiple sclerosis Brother   . Multiple myeloma Maternal Aunt   . Epilepsy Maternal Aunt   . Breast cancer Maternal Great-grandmother 23   Adrienne Hall has 2 daughters, 81 and 7. She has 2 brothers. One of her brothers was diagnosed with soft tissue sarcoma at 52 and melanoma at 44, he is being treated for this at the [place]. Her other brother had a benign brain tumor removed.   Adrienne Hall mother is living in her 85s. Patient had 5 maternal aunts, 3 uncles. One aunt recently passed from multiple myeloma at 56. No known cancers in maternal cousins. Maternal grandmother  passed of Alzheimer's at 49 and had a sister who possibly had cancer. Her mother, patient's great grandmother, had breast cancer at 102. Patient's maternal grandfather died at 52.   Adrienne Hall father is living in his 80s. Patient has 1 paternal aunt, 1 paternal uncle, neither have had cancer. Paternal grandmother had breast cancer in her 6s and passed from it, grandfather died in his 64s-60s.  Adrienne Hall is unaware of previous family history of genetic testing for hereditary cancer risks. Patient's ancestors are of [place], [place], [place], Native [place], [place] descent. There is no reported Ashkenazi Jewish ancestry. There is no known consanguinity.      GENETIC TEST RESULTS: Genetic testing reported out on 05/21/2021 through the [facility] Multi-Cancer+RNA cancer panel found no pathogenic mutations.   The Multi-Cancer Panel + RNA offered by [facility] includes sequencing and/or deletion duplication testing of the following 84 genes: AIP, ALK, APC, ATM, AXIN2,BAP1,  BARD1, BLM, BMPR1A, BRCA1, BRCA2, BRIP1, CASR, CDC73, CDH1, CDK4, CDKN1B, CDKN1C, CDKN2A (p14ARF), CDKN2A (p16INK4a), CEBPA, CHEK2, CTNNA1, DICER1, DIS3L2, EGFR (c.2369C>T, p.Thr790Met variant only), EPCAM (Deletion/duplication testing only), FH, FLCN, GATA2, GPC3, GREM1 (Promoter region deletion/duplication testing only), HOXB13 (c.251G>A, p.Gly84Glu), HRAS, KIT, MAX, MEN1, MET, MITF (c.952G>A, p.Glu318Lys variant only), MLH1, MSH2, MSH3, MSH6, MUTYH, NBN, NF1, NF2, NTHL1, PALB2, PDGFRA, PHOX2B, PMS2, POLD1, POLE, POT1, PRKAR1A, PTCH1, PTEN, RAD50, RAD51C, RAD51D, RB1, RECQL4, RET, RUNX1, SDHAF2, SDHA (sequence changes only), SDHB, SDHC, SDHD, SMAD4, SMARCA4, SMARCB1,  SMARCE1, STK11, SUFU, TERC, TERT, TMEM127, TP53, TSC1, TSC2, VHL, WRN and WT1.  The test report has been scanned into EPIC and is located under the Molecular Pathology section of the Results Review tab.  A portion of the result report is included below for reference.      We discussed with Adrienne Hall that because current genetic testing is not perfect, it is possible there may be a gene mutation in one of these genes that current testing cannot detect, but that chance is small.  We also discussed, that there could be another gene that has not yet been discovered, or that we have not yet tested, that is responsible for the cancer diagnoses in the family. It is also possible there is a hereditary cause for the cancer in the family that Adrienne Hall did not inherit and therefore was not identified in her testing.  Therefore, it is important to remain in touch with cancer genetics in the future so that we can continue to offer Adrienne Hall the most up to date genetic testing.   Genetic testing did identify a variant of uncertain significance (VUS) in the POLD1 gene called c.665C>T.  At this time, it is unknown if this variant is associated with increased cancer risk or if this is a normal finding, but most variants such as this get reclassified to being inconsequential. It should not be used to make medical management decisions. With time, we suspect the lab will determine the significance of this variant, if any. If we do learn more about it, we will try to contact Adrienne Hall to discuss it further. However, it is important to stay in touch with [place] periodically and keep the address and phone number up to date.  ADDITIONAL GENETIC TESTING: We discussed with Adrienne Hall that her genetic testing was fairly extensive.  If there are genes identified to increase cancer risk that can be analyzed in the future, we would be happy to discuss and coordinate this testing at that time.    CANCER SCREENING RECOMMENDATIONS: Adrienne Hall test result is considered negative (normal).  This means that we have not identified a hereditary cause for her  family history of cancer at this time.   While reassuring, this does not definitively rule out a hereditary predisposition to cancer. It is still  possible that there could be genetic mutations that are undetectable by current technology. There could be genetic mutations in genes that have not been tested or identified to increase cancer risk.  Therefore, it is recommended she continue to follow the cancer management and screening guidelines provided by her primary healthcare provider.   An individual's cancer risk and medical management are not determined by genetic test results alone. Overall cancer risk assessment incorporates additional factors, including personal medical history, family history, and any available genetic information that may result in a personalized plan for cancer prevention and surveillance.  Based on Adrienne Hall's personal and family history of cancer as well as her genetic test results, risk model Harriett Rush was used to estimate her risk of developing breast cancer. This estimates her lifetime risk of developing breast cancer to be approximately 12%.  The patient's lifetime breast cancer risk is a preliminary estimate based on available information using one of several models endorsed by the [place] (ACS). The ACS recommends consideration of breast MRI screening as an adjunct to mammography for patients at high risk (defined as 20% or greater lifetime risk).    RECOMMENDATIONS FOR FAMILY  MEMBERS:  Relatives in this family might be at some increased risk of developing cancer, over the general population risk, simply due to the family history of cancer.  We recommended female relatives in this family have a yearly mammogram beginning at age 56, or 68 years younger than the earliest onset of cancer, an annual clinical breast exam, and perform monthly breast self-exams. Female relatives in this family should also have a gynecological exam as recommended by their primary provider.  All family members should be referred for colonoscopy starting at age 59.    It is also possible there is a hereditary cause for the  cancer in Adrienne Hall's family that she did not inherit and therefore was not identified in her.  Based on Adrienne Hall's family history, we recommended her brother who has had melanoma and sarcoma have genetic counseling and testing. Adrienne Hall will let us know if we can be of any assistance in coordinating genetic counseling and/or testing for these family members.  FOLLOW-UP: Lastly, we discussed with Adrienne Hall that cancer genetics is a rapidly advancing field and it is possible that new genetic tests will be appropriate for her and/or her family members in the future. We encouraged her to remain in contact with cancer genetics on an annual basis so we can update her personal and family histories and let her know of advances in cancer genetics that may benefit this family.   Our contact number was provided. Adrienne Hall questions were answered to her satisfaction, and she knows she is welcome to call us at anytime with additional questions or concerns.   Faith Rogue, MS, [facility] Genetic Counselor [email or web address] Phone: [phone]

## 2021-06-22 ENCOUNTER — Ambulatory Visit: Payer: No Typology Code available for payment source | Admitting: Dermatology

## 2021-11-24 ENCOUNTER — Telehealth: Payer: Self-pay | Admitting: Family Medicine

## 2021-11-24 DIAGNOSIS — Z8619 Personal history of other infectious and parasitic diseases: Secondary | ICD-10-CM

## 2021-11-24 NOTE — Telephone Encounter (Signed)
Left message to return call to our office.  

## 2021-11-24 NOTE — Telephone Encounter (Signed)
Pt returned called wants  to know status of refill . Would ike a call back [phone]

## 2021-11-24 NOTE — Telephone Encounter (Signed)
Called patient she states she called to get refill and it had expired. She does need refill at this time.

## 2021-11-24 NOTE — Telephone Encounter (Signed)
  Encourage patient to contact the pharmacy for refills or they can request refills through [place]:  Please schedule appointment if longer than 1 year  NEXT APPOINTMENT DATE:no future appt  MEDICATION:valACYclovir (VALTREX) 500 MG tablet  Is the patient out of medication? yes  [place], [place]  Let patient know to contact pharmacy at the end of the day to make sure medication is ready.  Please notify patient to allow 48-72 hours to process  CLINICAL FILLS OUT ALL BELOW:   LAST REFILL:  QTY:  REFILL DATE:    OTHER COMMENTS:    Okay for refill?  Please advise

## 2021-11-24 NOTE — Telephone Encounter (Signed)
Does she take her Valtrex medication daily or as needed?  Also, is this for genital herpes or cold sores?

## 2021-11-25 NOTE — Telephone Encounter (Signed)
Spoke to patient she does take daily for genital herpes. She is aware message may not be responded to until Monday.

## 2021-11-26 MED ORDER — VALACYCLOVIR HCL 500 MG PO TABS: 500.0000 mg | ORAL_TABLET | Freq: Every day | ORAL | 0 refills | Status: DC

## 2021-11-26 NOTE — Telephone Encounter (Signed)
Noted. Refill(s) sent to pharmacy.  

## 2022-01-05 ENCOUNTER — Encounter: Payer: Self-pay | Admitting: Family

## 2022-01-05 ENCOUNTER — Other Ambulatory Visit: Payer: Self-pay

## 2022-01-05 ENCOUNTER — Ambulatory Visit (INDEPENDENT_AMBULATORY_CARE_PROVIDER_SITE_OTHER): Payer: No Typology Code available for payment source | Admitting: Family

## 2022-01-05 VITALS — BP 118/82 | HR 86 | Temp 97.2°F | Ht 66.0 in | Wt 197.0 lb

## 2022-01-05 DIAGNOSIS — Z808 Family history of malignant neoplasm of other organs or systems: Secondary | ICD-10-CM | POA: Diagnosis not present

## 2022-01-05 DIAGNOSIS — F321 Major depressive disorder, single episode, moderate: Secondary | ICD-10-CM

## 2022-01-05 DIAGNOSIS — F418 Other specified anxiety disorders: Secondary | ICD-10-CM

## 2022-01-05 MED ORDER — FLUOXETINE HCL 20 MG PO TABS: 20.0000 mg | ORAL_TABLET | Freq: Every day | ORAL | 1 refills | Status: DC

## 2022-01-05 NOTE — Assessment & Plan Note (Signed)
Referral placed for dermatologist as patient should be following closely due to family history of melanoma.  Patient okay with this referral.
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
## 2022-01-05 NOTE — Patient Instructions (Signed)
Start prozac 20 mg for anxiety and depression. Take 1/2 tablet by mouth once daily for about one week, then increase to 1 full tablet thereafter.   Taking the medicine as directed and not missing any doses is one of the best things you can do to treat your anxiety/depression.  Here are some things to keep in mind:  Side effects (stomach upset, some increased anxiety) may happen before you notice a benefit.  These side effects typically go away over time. Changes to your dose of medicine or a change in medication all together is sometimes necessary Many people will notice an improvement within two weeks but the full effect of the medication can take up to 4-6 weeks Stopping the medication when you start feeling better often results in a return of symptoms. Most people need to be on medication at least 6-12 months If you start having thoughts of hurting yourself or others after starting this medicine, please call me immediately.    A referral was placed today for both psychology and psychiatry.  Please let us know if you have not heard back within 1 week about your referral.  At the same time I do recommend that you check out [email or web address], and you can look at bios and photos that are typically associated by insurance. Otherwise you can also also call your insurance to request mental health providers that are available.   It was a pleasure seeing you today! Please do not hesitate to reach out with any questions and or concerns.  Regards,   Eugenia Pancoast FNP-C

## 2022-01-05 NOTE — Assessment & Plan Note (Signed)
Pt started on prozac 20 mg once daily. I instructed pt to start 1/2 tablet once daily for 1 week and then increase to a full tablet once daily on week two as tolerated.  We discussed common side effects such as nausea, drowsiness and weight gain.  Also discussed rare but serious side effect of suicidal ideation.  She is instructed to discontinue medication and go directly to ED if this occurs.  Pt verbalizes understanding.  Plan is to follow up in 30 days to evaluate progress.    Referral placed for psychiatry as well as psychology.  Recommended EMDR and/or cognitive behavioral therapy approach with psychology.

## 2022-01-24 ENCOUNTER — Ambulatory Visit (INDEPENDENT_AMBULATORY_CARE_PROVIDER_SITE_OTHER): Payer: No Typology Code available for payment source | Admitting: Professional

## 2022-01-24 ENCOUNTER — Encounter: Payer: Self-pay | Admitting: Professional

## 2022-01-24 DIAGNOSIS — F4323 Adjustment disorder with mixed anxiety and depressed mood: Secondary | ICD-10-CM | POA: Diagnosis not present
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
## 2022-01-24 NOTE — Progress Notes (Signed)
° ° ° ° ° ° ° ° ° ° ° ° ° ° °  Daijon Wenke, LCMHC °

## 2022-01-26 ENCOUNTER — Other Ambulatory Visit: Payer: Self-pay

## 2022-01-26 ENCOUNTER — Telehealth: Payer: Self-pay

## 2022-01-26 ENCOUNTER — Ambulatory Visit (INDEPENDENT_AMBULATORY_CARE_PROVIDER_SITE_OTHER): Payer: No Typology Code available for payment source | Admitting: Family

## 2022-01-26 ENCOUNTER — Encounter: Payer: Self-pay | Admitting: Family

## 2022-01-26 VITALS — BP 116/84 | HR 65 | Temp 97.0°F | Ht 66.0 in | Wt 192.0 lb

## 2022-01-26 DIAGNOSIS — F4323 Adjustment disorder with mixed anxiety and depressed mood: Secondary | ICD-10-CM | POA: Diagnosis not present

## 2022-01-26 DIAGNOSIS — F418 Other specified anxiety disorders: Secondary | ICD-10-CM | POA: Diagnosis not present

## 2022-01-26 DIAGNOSIS — F321 Major depressive disorder, single episode, moderate: Secondary | ICD-10-CM | POA: Diagnosis not present

## 2022-01-26 DIAGNOSIS — Z808 Family history of malignant neoplasm of other organs or systems: Secondary | ICD-10-CM

## 2022-01-26 MED ORDER — FLUOXETINE HCL 40 MG PO CAPS: 40.0000 mg | ORAL_CAPSULE | Freq: Every day | ORAL | 3 refills | Status: DC

## 2022-01-26 NOTE — Patient Instructions (Signed)
We are increasing your prozac today to 40 mg once daily. Let me know if you have any undesired thoughts and or side effects with the increase. I will have you follow up after 04/13/22 for your annual physical.   It was a pleasure seeing you today! Please do not hesitate to reach out with any questions and or concerns.  Regards,   Eugenia Pancoast FNP-C

## 2022-01-26 NOTE — Assessment & Plan Note (Signed)
Increase prozac to 40 mg one po once daily. Pt to continue f/u with psychology biweekly/or weekly depending on her work scheduled. If any SI [place] go to er and or call 911. Will have pt f/u in April for her CPE.

## 2022-01-26 NOTE — Assessment & Plan Note (Signed)
Pt had appt pending with derm, f/u as scheduled.

## 2022-01-26 NOTE — Progress Notes (Signed)
Established Patient Office Visit  Subjective:  [name], female    DOB: 1986/04/04  Age: 36 y.o. MRN: [id number]  CC: No chief complaint on file.   HPI Adrienne Hall is here today for follow up.  Pt is without acute concerns.  Depression with anxiety: started on prozac 20 mg last visit. Pt states tolerating well, and with desired effect so far of decreased appetite. Referral to psychiatry and psychology last visit as well, 2/1 appt at [facility] with Dr. Francie Massing which she states went well. Discussing whether weekly or biweekly depending on work schedule.   PHQ9 SCORE ONLY 01/05/2022 03/10/2020  PHQ-9 Total Score 13 5     [place] melanoma- referred to dermatology last visit. Has appt in July with [facility].   Past Medical History:  Diagnosis Date   Family history of breast cancer    Family history of melanoma    Family history of multiple myeloma    Herpes simplex    Mental disorder    "Borderline Personality Disorder"    Past Surgical History:  Procedure Laterality Date   CESAREAN SECTION     WISDOM TOOTH EXTRACTION      Family History  Problem Relation Age of Onset   Diabetes Maternal Grandmother    Hypertension Maternal Grandmother    Hyperlipidemia Maternal Grandmother    Alzheimer's disease Maternal Grandmother    Stroke Maternal Grandmother 79   Heart disease Maternal Grandfather    Hypertension Maternal Grandfather    Breast cancer Paternal Grandmother 87   Kidney disease Paternal Grandfather    Healthy Mother    Healthy Father    Diabetes Brother        associated with cancer   Melanoma Brother    Other Brother        soft tissue sarcoma; melanoma   Hypertension Brother    Hyperlipidemia Brother    Other Brother        hx brain tumor, was removed   Multiple sclerosis Brother    Multiple myeloma Maternal Aunt    Epilepsy Maternal Aunt    Breast cancer Maternal Great-grandmother 81    Social  History   Socioeconomic History   Marital status: Single    Spouse name: Not on file   Number of children: 2   Years of education: college   Highest education level: Not on file  Occupational History   Occupation: Conservation officer, nature  Tobacco Use   Smoking status: Never   Smokeless tobacco: Never  Vaping Use   Vaping Use: Never used  Substance and Sexual Activity   Alcohol use: Yes    Alcohol/week: 1.0 standard drink    Types: 1 Glasses of wine per week    Comment: once a week, glass of wine   Drug use: No   Sexual activity: Yes    Partners: Male    Birth control/protection: Implant, Condom  Other Topics Concern   Not on file  Social History Narrative   03/10/20   From: grew up here, most recently moved from [place]: with two daughters   Work: [employer] entry position      Family: [name] (2012) and Malcolm [place] (2015       Enjoys: reading and cleaning      Exercise: not currently   Diet: drinks a lot of water, too much caffeine       Safety   Seat belts: Yes  Guns: No   Safe in relationships: Yes    Social Determinants of Radio broadcast assistant Strain: Not on file  Food Insecurity: Not on file  Transportation Needs: Not on file  Physical Activity: Not on file  Stress: Not on file  Social Connections: Not on file  Intimate Partner Violence: Not on file    Outpatient Medications Prior to Visit  Medication Sig Dispense Refill   Multiple Vitamin (MULTIVITAMIN) tablet Take 1 tablet by mouth daily.     valACYclovir (VALTREX) 500 MG tablet Take 1 tablet (500 mg total) by mouth daily. For herpes prevention. 90 tablet 0   FLUoxetine (PROZAC) 20 MG tablet Take 1 tablet (20 mg total) by mouth daily. 30 tablet 1   No facility-administered medications prior to visit.    Allergies  Allergen Reactions   Penicillins Hives, Swelling and Rash    ROS Review of Systems  Constitutional:  Positive for fatigue. Negative for chills, fever and unexpected  weight change.  Respiratory:  Negative for shortness of breath.   Cardiovascular:  Negative for chest pain.  Psychiatric/Behavioral:  Negative for self-injury and suicidal ideas. The patient is nervous/anxious.        Depressed mood       Objective:    Physical Exam Constitutional:      General: She is not in acute distress.    Appearance: Normal appearance. She is normal weight. She is ill-appearing. She is not toxic-appearing or diaphoretic.  HENT:     Head: Normocephalic.  Cardiovascular:     Rate and Rhythm: Normal rate and regular rhythm.  Neurological:     General: No focal deficit present.     Mental Status: She is alert and oriented to person, place, and time.  Psychiatric:        Mood and Affect: Mood normal.        Behavior: Behavior normal.        Thought Content: Thought content normal.        Judgment: Judgment normal.      BP 116/84    Pulse 65    Temp (!) 97 F (36.1 C) (Temporal)    Ht 5' 6"  (1.676 m)    Wt 192 lb (87.1 kg)    SpO2 97%    BMI 30.99 kg/m  Wt Readings from Last 3 Encounters:  01/26/22 192 lb (87.1 kg)  01/05/22 197 lb (89.4 kg)  04/13/21 193 lb 12 oz (87.9 kg)     Health Maintenance Due  Topic Date Due   COVID-19 Vaccine (1) Never done   INFLUENZA VACCINE  07/24/2021    There are no preventive care reminders to display for this patient.  Lab Results  Component Value Date   TSH 1.01 04/13/2021   No results found for: WBC, HGB, HCT, MCV, PLT Lab Results  Component Value Date   NA 138 04/13/2021   K 4.3 04/13/2021   CO2 29 04/13/2021   GLUCOSE 95 04/13/2021   BUN 10 04/13/2021   CREATININE 1.05 04/13/2021   BILITOT 0.6 04/13/2021   ALKPHOS 45 04/13/2021   AST 10 04/13/2021   ALT 13 04/13/2021   PROT 7.4 04/13/2021   ALBUMIN 4.2 04/13/2021   CALCIUM 9.8 04/13/2021   GFR 68.68 04/13/2021   Lab Results  Component Value Date   CHOL 175 04/13/2021   Lab Results  Component Value Date   HDL 54.40 04/13/2021   Lab  Results  Component Value Date   LDLCALC 108 (  H) 04/13/2021   Lab Results  Component Value Date   TRIG 63.0 04/13/2021   Lab Results  Component Value Date   CHOLHDL 3 04/13/2021   No results found for: HGBA1C    Assessment & Plan:   Problem List Items Addressed This Visit       Other   Family history of melanoma    Pt had appt pending with derm, f/u as scheduled.       Adjustment disorder with mixed anxiety and depressed mood    Increase prozac to 40 mg one po once daily. Pt to continue f/u with psychology biweekly/or weekly depending on her work scheduled. If any SI HI go to er and or call 911. Will have pt f/u in April for her CPE.      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    Dispense:  90 capsule    Refill:  3    Order Specific Question:   Supervising Provider    Answer:   Diona Browner, AMY E [[id number]]    Follow-up: Return in about 3 months (around 04/25/2022) for annual physical.    Eugenia Pancoast, FNP

## 2022-01-26 NOTE — Telephone Encounter (Signed)
Patient is requesting a transfer from Dr. Einar Pheasant to Eugenia Pancoast NP, do you approve? If so we will contact her to set up for a TOC"

## 2022-01-28 NOTE — Telephone Encounter (Signed)
This can wait for Dr. Verda Cumins response for when she returns to the office this week.

## 2022-01-30 NOTE — Telephone Encounter (Signed)
That is fine with me.

## 2022-02-01 NOTE — Telephone Encounter (Signed)
Pt scheduled TOC w/ tabitha and also cancelled cpe/lab  with dr. Einar Pheasant

## 2022-02-07 ENCOUNTER — Ambulatory Visit: Payer: No Typology Code available for payment source | Admitting: Professional

## 2022-02-20 ENCOUNTER — Ambulatory Visit: Payer: No Typology Code available for payment source | Admitting: Professional

## 2022-02-26 ENCOUNTER — Encounter: Payer: Self-pay | Admitting: Family

## 2022-03-20 ENCOUNTER — Encounter: Payer: Self-pay | Admitting: Professional

## 2022-03-20 ENCOUNTER — Ambulatory Visit (INDEPENDENT_AMBULATORY_CARE_PROVIDER_SITE_OTHER): Payer: No Typology Code available for payment source | Admitting: Professional

## 2022-03-20 DIAGNOSIS — F4323 Adjustment disorder with mixed anxiety and depressed mood: Secondary | ICD-10-CM | POA: Diagnosis not present
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
## 2022-03-20 NOTE — Progress Notes (Signed)
° ° ° ° ° ° ° ° ° ° ° ° ° ° °  Evyn Kooyman, LCMHC °

## 2022-04-07 ENCOUNTER — Ambulatory Visit: Payer: No Typology Code available for payment source | Admitting: Professional

## 2022-04-21 ENCOUNTER — Ambulatory Visit: Payer: No Typology Code available for payment source | Admitting: Professional

## 2022-04-25 ENCOUNTER — Encounter: Payer: No Typology Code available for payment source | Admitting: Family Medicine

## 2022-04-25 ENCOUNTER — Ambulatory Visit (INDEPENDENT_AMBULATORY_CARE_PROVIDER_SITE_OTHER)
Admission: RE | Admit: 2022-04-25 | Discharge: 2022-04-25 | Disposition: A | Discharge status: Home / Self Care | Payer: No Typology Code available for payment source | Source: Ambulatory Visit | Attending: Family | Admitting: Family

## 2022-04-25 ENCOUNTER — Encounter: Payer: Self-pay | Admitting: Family

## 2022-04-25 ENCOUNTER — Ambulatory Visit (INDEPENDENT_AMBULATORY_CARE_PROVIDER_SITE_OTHER): Payer: No Typology Code available for payment source | Admitting: Family

## 2022-04-25 VITALS — BP 110/70 | HR 74 | Temp 98.9°F | Resp 16 | Ht 66.0 in | Wt 181.0 lb

## 2022-04-25 DIAGNOSIS — Z0001 Encounter for general adult medical examination with abnormal findings: Secondary | ICD-10-CM | POA: Diagnosis not present

## 2022-04-25 DIAGNOSIS — D229 Melanocytic nevi, unspecified: Secondary | ICD-10-CM

## 2022-04-25 DIAGNOSIS — Z809 Family history of malignant neoplasm, unspecified: Secondary | ICD-10-CM | POA: Diagnosis not present

## 2022-04-25 DIAGNOSIS — M5442 Lumbago with sciatica, left side: Secondary | ICD-10-CM

## 2022-04-25 DIAGNOSIS — Z8619 Personal history of other infectious and parasitic diseases: Secondary | ICD-10-CM

## 2022-04-25 DIAGNOSIS — E785 Hyperlipidemia, unspecified: Secondary | ICD-10-CM

## 2022-04-25 DIAGNOSIS — R200 Anesthesia of skin: Secondary | ICD-10-CM

## 2022-04-25 DIAGNOSIS — R5383 Other fatigue: Secondary | ICD-10-CM

## 2022-04-25 DIAGNOSIS — E663 Overweight: Secondary | ICD-10-CM

## 2022-04-25 DIAGNOSIS — Z6829 Body mass index (BMI) 29.0-29.9, adult: Secondary | ICD-10-CM

## 2022-04-25 DIAGNOSIS — Z807 Family history of other malignant neoplasms of lymphoid, hematopoietic and related tissues: Secondary | ICD-10-CM

## 2022-04-25 DIAGNOSIS — F4323 Adjustment disorder with mixed anxiety and depressed mood: Secondary | ICD-10-CM

## 2022-04-25 LAB — COMPREHENSIVE METABOLIC PANEL
ALT: 14 U/L (ref 0–35)
AST: 13 U/L (ref 0–37)
Albumin: 4.2 g/dL (ref 3.5–5.2)
Alkaline Phosphatase: 48 U/L (ref 39–117)
BUN: 8 mg/dL (ref 6–23)
CO2: 28 mEq/L (ref 19–32)
Calcium: 9.1 mg/dL (ref 8.4–10.5)
Chloride: 101 mEq/L (ref 96–112)
Creatinine, Ser: 0.92 mg/dL (ref 0.40–1.20)
GFR: 79.9 mL/min (ref >60.00)
Glucose, Bld: 91 mg/dL (ref 70–99)
Potassium: 4.2 mEq/L (ref 3.5–5.1)
Sodium: 135 mEq/L (ref 135–145)
Total Bilirubin: 0.8 mg/dL (ref 0.2–1.2)
Total Protein: 7.1 g/dL (ref 6.0–8.3)

## 2022-04-25 LAB — CBC WITH DIFFERENTIAL/PLATELET
Basophils Absolute: 0 10*3/uL (ref 0.0–0.1)
Basophils Relative: 1 % (ref 0.0–3.0)
Eosinophils Absolute: 0.3 10*3/uL (ref 0.0–0.7)
Eosinophils Relative: 9 % — ABNORMAL HIGH (ref 0.0–5.0)
HCT: 41.3 % (ref 36.0–46.0)
Hemoglobin: 13.7 g/dL (ref 12.0–15.0)
Lymphocytes Relative: 17 % (ref 12.0–46.0)
Lymphs Abs: 0.6 10*3/uL — ABNORMAL LOW (ref 0.7–4.0)
MCHC: 33.1 g/dL (ref 30.0–36.0)
MCV: 93.9 fl (ref 78.0–100.0)
Monocytes Absolute: 0.6 10*3/uL (ref 0.1–1.0)
Monocytes Relative: 16.6 % — ABNORMAL HIGH (ref 3.0–12.0)
Neutro Abs: 2.1 10*3/uL (ref 1.4–7.7)
Neutrophils Relative %: 56.4 % (ref 43.0–77.0)
Platelets: 275 10*3/uL (ref 150.0–400.0)
RBC: 4.4 Mil/uL (ref 3.87–5.11)
RDW: 13.4 % (ref 11.5–15.5)
WBC: 3.7 10*3/uL — ABNORMAL LOW (ref 4.0–10.5)

## 2022-04-25 LAB — LIPID PANEL
Cholesterol: 207 mg/dL — ABNORMAL HIGH (ref 0–200)
HDL: 63.9 mg/dL (ref >39.00)
LDL Cholesterol: 130 mg/dL — ABNORMAL HIGH (ref 0–99)
NonHDL: 142.89
Total CHOL/HDL Ratio: 3
Triglycerides: 66 mg/dL (ref 0.0–149.0)
VLDL: 13.2 mg/dL (ref 0.0–40.0)

## 2022-04-25 LAB — B12 AND FOLATE PANEL
Folate: 13.1 ng/mL (ref >5.9)
Vitamin B-12: 430 pg/mL (ref 211–911)

## 2022-04-25 LAB — TSH: TSH: 1.38 u[IU]/mL (ref 0.35–5.50)

## 2022-04-25 MED ORDER — METHYLPREDNISOLONE 4 MG PO TBPK: ORAL_TABLET | ORAL | 0 refills | Status: DC

## 2022-04-25 NOTE — Assessment & Plan Note (Signed)
Lower back xray, pending results.  ?Consider neurology consult.  ?

## 2022-04-25 NOTE — Assessment & Plan Note (Signed)
Patient Counseling(The following topics were reviewed): ? Preventative care handout given to pt  ?Health maintenance and immunizations reviewed. Please refer to Health maintenance section. ?Pt advised on safe sex, wearing seatbelts in car, and proper nutrition ?labwork ordered today for annual ?Dental health: Discussed importance of regular tooth brushing, flossing, and dental visits. ? ? ?

## 2022-04-25 NOTE — Assessment & Plan Note (Signed)
Continue prozac 40 mg  ?Cont f/u with psychology ?Consider wellbutrin if additional treatment needs ?

## 2022-04-25 NOTE — Assessment & Plan Note (Signed)
Keep dermatology appt as scheduled. ?

## 2022-04-25 NOTE — Assessment & Plan Note (Signed)
Reviewed most recent lipid panel.  ?Ordered lipid panel, pending results. Work on low cholesterol diet and exercise as tolerated ? ?

## 2022-04-25 NOTE — Assessment & Plan Note (Signed)
continue with diet and exercise.  ?

## 2022-04-25 NOTE — Assessment & Plan Note (Signed)
Ordering low back xray, pending results.  ?Sciatica exercises sent to pt mychart and printed out for her as well.  ?Heat/ice prn. Ibuprofen/naproxen prn.  ?Will also consider neurology consult with left sited weakness where leg completely goes numb if negative lumbar xray.  ?

## 2022-04-25 NOTE — Progress Notes (Signed)
? ?Established Patient Office Visit ? ?Subjective:  ?[name], female    DOB: February 15, 1986  Age: 36 y.o. MRN: [id number] ? ?CC:  ?Chief Complaint  ?Patient presents with  ? Transitions Of Care  ? ? ?HPI ?Adrienne Hall is here for a transition of care visit. ? ?Prior provider was: Dr. Waunita Schooner, MD.  ?Pt is without acute concerns.  ? ?Pt states that occasionally over the last two years she will experience numbness of her left leg. She does report it originates from her left lower buttock and will have a shooting pain down her left leg to foot however goes completely numb and she will fall at times and be unable to bear weight on the leg.  ?She does find it happens more when she sits for longer periods of time. [place], and leg gave out during that time.  ? ?chronic concerns: ? ?Depression with anxiety: increased to prozac 40 mg once daily, at first had a little more energy.  ?Still seeing therapist every two weeks which has been helpful as well.  ? ?  04/25/2022  ?  8:32 AM 01/05/2022  ?  9:13 AM  ?GAD 7 : Generalized Anxiety Score  ?Nervous, Anxious, on Edge 0 3  ?Control/stop worrying 0 3  ?Worry too much - different things 1 3  ?Trouble relaxing 1 3  ?Restless 0 0  ?Easily annoyed or irritable 1 3  ?Afraid - awful might happen 0 3  ?Total GAD 7 Score 3 18  ?Anxiety Difficulty Somewhat difficult Somewhat difficult  ? ? ? ?  04/25/2022  ?  8:33 AM 01/05/2022  ?  8:57 AM 03/10/2020  ? 10:15 AM  ?PHQ9 SCORE ONLY  ?PHQ-9 Total Score _0 ? ? ? ?Allergic rhinitis: having some allergies, runny nose. Not taking anything at current. Sneezing a lot.  ? ? ? ?Past Medical History:  ?Diagnosis Date  ? Family history of breast cancer   ? Family history of cancer in brother 04/13/2021  ? Melanoma, soft tissue sarcoma  ? Family history of melanoma   ? Family history of multiple myeloma   ? Herpes simplex   ? Mental disorder   ? "Borderline Personality Disorder"  ? ? ?Past Surgical  History:  ?Procedure Laterality Date  ? CESAREAN SECTION    ? WISDOM TOOTH EXTRACTION    ? ? ?Family History  ?Problem Relation Age of Onset  ? Diabetes Maternal Grandmother   ? Hypertension Maternal Grandmother   ? Hyperlipidemia Maternal Grandmother   ? Alzheimer's disease Maternal Grandmother   ? Stroke Maternal Grandmother 57  ? Heart disease Maternal Grandfather   ? Hypertension Maternal Grandfather   ? Breast cancer Paternal Grandmother 16  ? Kidney disease Paternal Grandfather   ? Healthy Mother   ? Healthy Father   ? Diabetes Brother   ?     associated with cancer  ? Melanoma Brother   ? Other Brother   ?     soft tissue sarcoma; melanoma  ? Hypertension Brother   ? Hyperlipidemia Brother   ? Other Brother   ?     hx brain tumor, was removed  ? Multiple sclerosis Brother   ? Multiple myeloma Maternal Aunt   ? Epilepsy Maternal Aunt   ? Breast cancer Maternal Great-grandmother 15  ? ? ?Social History  ? ?Socioeconomic History  ? Marital status: Single  ?  Spouse name:  Not on file  ? Number of children: 2  ? Years of education: college  ? Highest education level: Not on file  ?Occupational History  ? Occupation: [employer]  ?  Employer: [employer]  ? Occupation: Manufacturing engineer  ?  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 Smoking status: Never  ? Smokeless tobacco: Never  ?Vaping Use  ? Vaping Use: Never used  ?Substance and Sexual Activity  ? Alcohol use: Yes  ?  Alcohol/week: 1.0 standard drink  ?  Types: 1 Glasses of wine per week  ?  Comment: once a week, glass of wine  ? Drug use: No  ? Sexual activity: Yes  ?  Partners: Male  ?  Birth control/protection: Implant, Condom  ?Other Topics Concern  ? Not on file  ?Social History Narrative  ? 03/10/20  ? From: grew up here, most recently moved from [place]   ? Living: with two daughters  ? Work: [employer] entry position  ?   ? Family: [name] (2012) and [name] (2015)  ?   ? Enjoys: reading and cleaning  ?   ?  Exercise: not currently  ? Diet: drinks a lot of water, too much caffeine   ?   ? Safety  ? Seat belts: Yes   ? Guns: No  ? Safe in relationships: Yes   ? ?Social Determinants of Health  ? ?Financial Resource Strain: Not on file  ?Food Insecurity: Not on file  ?Transportation Needs: Not on file  ?Physical Activity: Not on file  ?Stress: Not on file  ?Social Connections: Not on file  ?Intimate Partner Violence: Not on file  ? ? ?Outpatient Medications Prior to Visit  ?Medication Sig Dispense Refill  ? FLUoxetine (PROZAC) 40 MG capsule Take 1 capsule (40 mg total) by mouth daily. 90 capsule 3  ? Multiple Vitamin (MULTIVITAMIN) tablet Take 1 tablet by mouth daily.    ? valACYclovir (VALTREX) 500 MG tablet Take 1 tablet (500 mg total) by mouth daily. For herpes prevention. 90 tablet 0  ? ?No facility-administered medications prior to visit.  ? ? ?Allergies  ?Allergen Reactions  ? Penicillins Hives, Swelling and Rash  ? ? ?ROS ?Review of Systems  ?Constitutional:  Negative for chills and fever.  ?HENT:  Positive for congestion, postnasal drip, rhinorrhea and sneezing. Negative for ear pain, sinus pressure and sore throat.   ?Respiratory:  Negative for cough, shortness of breath and wheezing.   ?Cardiovascular:  Negative for chest pain and palpitations.  ?Psychiatric/Behavioral:    ?     Depression at times  ? ? ?  ?Objective:  ?  ?Physical Exam ?Vitals reviewed.  ?Constitutional:   ?   General: She is not in acute distress. ?   Appearance: Normal appearance. She is not ill-appearing or toxic-appearing.  ?HENT:  ?   Right Ear: Tympanic membrane normal.  ?   Left Ear: Tympanic membrane normal.  ?   Nose: Mucosal edema and congestion present.  ?   Right Turbinates: Enlarged and swollen.  ?   Left Turbinates: Enlarged and swollen.  ?   Right Sinus: Frontal sinus tenderness present.  ?   Left Sinus: Frontal sinus tenderness present.  ?   Mouth/Throat:  ?   Mouth: Mucous membranes are moist.  ?   Pharynx: Posterior  oropharyngeal erythema present. No pharyngeal swelling.  ?   Tonsils: No tonsillar exudate.  ?Eyes:  ?   Extraocular  Movements: Extraocular movements intact.  ?   Conjunctiva/sclera: Conjunctivae normal.  ?   Pupils: Pupils are equal, round, and reactive to light.  ?Neck:  ?   Thyroid: No thyroid mass.  ?Cardiovascular:  ?   Rate and Rhythm: Normal rate and regular rhythm.  ?Pulmonary:  ?   Effort: Pulmonary effort is normal.  ?   Breath sounds: Normal breath sounds.  ?Abdominal:  ?   General: Abdomen is flat. Bowel sounds are normal.  ?   Palpations: Abdomen is soft.  ?Musculoskeletal:     ?   General: Normal range of motion.  ?Lymphadenopathy:  ?   Cervical:  ?   Right cervical: No superficial cervical adenopathy. ?   Left cervical: No superficial cervical adenopathy.  ?Skin: ?   General: Skin is warm.  ?   Capillary Refill: Capillary refill takes less than 2 seconds.  ?Neurological:  ?   General: No focal deficit present.  ?   Mental Status: She is alert and oriented to person, place, and time.  ?Psychiatric:     ?   Mood and Affect: Mood normal.     ?   Behavior: Behavior normal.     ?   Thought Content: Thought content normal.     ?   Judgment: Judgment normal.  ? ? ? ? ?BP 110/70   Pulse 74   Temp 98.9 ?F (37.2 ?C)   Resp 16   Ht 5' 6" (1.676 m)   Wt 181 lb (82.1 kg)   SpO2 98%   BMI 29.21 kg/m?  ?Wt Readings from Last 3 Encounters:  ?04/25/22 181 lb (82.1 kg)  ?01/26/22 192 lb (87.1 kg)  ?01/05/22 197 lb (89.4 kg)  ? ? ? ?There are no preventive care reminders to display for this patient. ? ? ?There are no preventive care reminders to display for this patient. ? ?Lab Results  ?Component Value Date  ? TSH 1.38 04/25/2022  ? ?Lab Results  ?Component Value Date  ? WBC 3.7 (L) 04/25/2022  ? HGB 13.7 04/25/2022  ? HCT 41.3 04/25/2022  ? MCV 93.9 04/25/2022  ? PLT 275.0 04/25/2022  ? ?Lab Results  ?Component Value Date  ? NA 135 04/25/2022  ? K 4.2 04/25/2022  ? CO2 28 04/25/2022  ? GLUCOSE 91 04/25/2022  ?  BUN 8 04/25/2022  ? CREATININE 0.92 04/25/2022  ? BILITOT 0.8 04/25/2022  ? ALKPHOS 48 04/25/2022  ? AST 13 04/25/2022  ? ALT 14 04/25/2022  ? PROT 7.1 04/25/2022  ? ALBUMIN 4.2 04/25/2022  ? CALCIUM 9.1 04/25/2022  ? GFR 79

## 2022-04-25 NOTE — Assessment & Plan Note (Signed)
Continue valtrex  

## 2022-04-25 NOTE — Assessment & Plan Note (Signed)
Workup to include tsh, cbc, b12 folate ?

## 2022-04-25 NOTE — Patient Instructions (Signed)
Recommend daily flonase nose spray and also nightly zyrtec.  ? ?Due to recent changes in healthcare laws, you may see results of your imaging and/or laboratory studies on MyChart before I have had a chance to review them.  I understand that in some cases there may be results that are confusing or concerning to you. Please understand that not all results are received at the same time and often I may need to interpret multiple results in order to provide you with the best plan of care or course of treatment. Therefore, I ask that you please give me 2 business days to thoroughly review all your results before contacting my office for clarification. Should we see a critical lab result, you will be contacted sooner.  ? ?It was a pleasure seeing you today! Please do not hesitate to reach out with any questions and or concerns. ? ?Regards,  ? ?Lessly Stigler ?FNP-C ? ?

## 2022-04-26 ENCOUNTER — Encounter: Payer: Self-pay | Admitting: Family

## 2022-04-26 ENCOUNTER — Other Ambulatory Visit: Payer: Self-pay | Admitting: Family

## 2022-04-26 DIAGNOSIS — R200 Anesthesia of skin: Secondary | ICD-10-CM

## 2022-04-26 DIAGNOSIS — R29898 Other symptoms and signs involving the musculoskeletal system: Secondary | ICD-10-CM | POA: Insufficient documentation

## 2022-04-27 ENCOUNTER — Other Ambulatory Visit: Payer: Self-pay | Admitting: Family

## 2022-04-27 DIAGNOSIS — Z8619 Personal history of other infectious and parasitic diseases: Secondary | ICD-10-CM

## 2022-04-27 DIAGNOSIS — F321 Major depressive disorder, single episode, moderate: Secondary | ICD-10-CM

## 2022-04-27 MED ORDER — VALACYCLOVIR HCL 500 MG PO TABS: 500.0000 mg | ORAL_TABLET | Freq: Every day | ORAL | 3 refills | Status: DC

## 2022-04-27 MED ORDER — FLUOXETINE HCL 40 MG PO CAPS: 40.0000 mg | ORAL_CAPSULE | Freq: Every day | ORAL | 3 refills | Status: DC

## 2022-05-01 ENCOUNTER — Encounter: Payer: Self-pay | Admitting: Neurology

## 2022-05-09 ENCOUNTER — Ambulatory Visit: Payer: No Typology Code available for payment source | Admitting: Professional

## 2022-05-25 ENCOUNTER — Encounter: Payer: Self-pay | Admitting: Professional

## 2022-05-25 ENCOUNTER — Ambulatory Visit (INDEPENDENT_AMBULATORY_CARE_PROVIDER_SITE_OTHER): Payer: No Typology Code available for payment source | Admitting: Professional

## 2022-05-25 DIAGNOSIS — F4323 Adjustment disorder with mixed anxiety and depressed mood: Secondary | ICD-10-CM

## 2022-05-25 DIAGNOSIS — F4310 Post-traumatic stress disorder, unspecified: Secondary | ICD-10-CM

## 2022-05-25 HISTORY — DX: Post-traumatic stress disorder, unspecified: F43.10
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
## 2022-06-06 ENCOUNTER — Ambulatory Visit (INDEPENDENT_AMBULATORY_CARE_PROVIDER_SITE_OTHER): Payer: No Typology Code available for payment source | Admitting: Professional

## 2022-06-06 ENCOUNTER — Encounter: Payer: Self-pay | Admitting: Professional

## 2022-06-06 DIAGNOSIS — F4310 Post-traumatic stress disorder, unspecified: Secondary | ICD-10-CM

## 2022-06-06 DIAGNOSIS — F4323 Adjustment disorder with mixed anxiety and depressed mood: Secondary | ICD-10-CM
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
## 2022-06-07 ENCOUNTER — Encounter: Payer: Self-pay | Admitting: Family

## 2022-06-10 ENCOUNTER — Other Ambulatory Visit: Payer: Self-pay | Admitting: Family

## 2022-06-12 NOTE — Telephone Encounter (Deleted)
Please see notes in the referral in Epic.  Referral to Pulmonary from 04/2021 which is now expired as of 05/15/22  Pulmonary tried calling the patient several times to schedule and did not get a call back. Notes in referral - Referral was closed.   New referral needs to be placed if still needing the appt and the patient needs to contact Pulmonary to schedule.

## 2022-07-03 ENCOUNTER — Encounter: Payer: Self-pay | Admitting: Family

## 2022-07-03 ENCOUNTER — Observation Stay
Admission: EM | Admit: 2022-07-03 | Discharge: 2022-07-04 | Disposition: A | Discharge status: Home / Self Care | Payer: No Typology Code available for payment source | Attending: Surgery | Admitting: Surgery

## 2022-07-03 ENCOUNTER — Other Ambulatory Visit: Payer: Self-pay

## 2022-07-03 ENCOUNTER — Emergency Department: Payer: No Typology Code available for payment source

## 2022-07-03 DIAGNOSIS — K8000 Calculus of gallbladder with acute cholecystitis without obstruction: Secondary | ICD-10-CM

## 2022-07-03 DIAGNOSIS — K8012 Calculus of gallbladder with acute and chronic cholecystitis without obstruction: Principal | ICD-10-CM | POA: Insufficient documentation

## 2022-07-03 DIAGNOSIS — K819 Cholecystitis, unspecified: Secondary | ICD-10-CM | POA: Diagnosis present

## 2022-07-03 DIAGNOSIS — R1011 Right upper quadrant pain: Secondary | ICD-10-CM | POA: Diagnosis present

## 2022-07-03 LAB — CBC WITH DIFFERENTIAL/PLATELET
Abs Immature Granulocytes: 0.01 10*3/uL (ref 0.00–0.07)
Basophils Absolute: 0 10*3/uL (ref 0.0–0.1)
Basophils Relative: 1 %
Eosinophils Absolute: 0.3 10*3/uL (ref 0.0–0.5)
Eosinophils Relative: 6 %
HCT: 39.3 % (ref 36.0–46.0)
Hemoglobin: 12.8 g/dL (ref 12.0–15.0)
Immature Granulocytes: 0 %
Lymphocytes Relative: 37 %
Lymphs Abs: 2 10*3/uL (ref 0.7–4.0)
MCH: 30.9 pg (ref 26.0–34.0)
MCHC: 32.6 g/dL (ref 30.0–36.0)
MCV: 94.9 fL (ref 80.0–100.0)
Monocytes Absolute: 0.4 10*3/uL (ref 0.1–1.0)
Monocytes Relative: 8 %
Neutro Abs: 2.6 10*3/uL (ref 1.7–7.7)
Neutrophils Relative %: 48 %
Platelets: 268 10*3/uL (ref 150–400)
RBC: 4.14 MIL/uL (ref 3.87–5.11)
RDW: 12.6 % (ref 11.5–15.5)
WBC: 5.3 10*3/uL (ref 4.0–10.5)
nRBC: 0 % (ref 0.0–0.2)

## 2022-07-03 LAB — COMPREHENSIVE METABOLIC PANEL
ALT: 15 U/L (ref 0–44)
AST: 13 U/L — ABNORMAL LOW (ref 15–41)
Albumin: 3.8 g/dL (ref 3.5–5.0)
Alkaline Phosphatase: 41 U/L (ref 38–126)
Anion gap: 7 (ref 5–15)
BUN: 13 mg/dL (ref 6–20)
CO2: 25 mmol/L (ref 22–32)
Calcium: 9.2 mg/dL (ref 8.9–10.3)
Chloride: 105 mmol/L (ref 98–111)
Creatinine, Ser: 0.92 mg/dL (ref 0.44–1.00)
GFR, Estimated: >60 mL/min (ref >60)
Glucose, Bld: 129 mg/dL — ABNORMAL HIGH (ref 70–99)
Potassium: 4 mmol/L (ref 3.5–5.1)
Sodium: 137 mmol/L (ref 135–145)
Total Bilirubin: 0.5 mg/dL (ref 0.3–1.2)
Total Protein: 6.8 g/dL (ref 6.5–8.1)

## 2022-07-03 LAB — URINALYSIS, ROUTINE W REFLEX MICROSCOPIC
Bilirubin Urine: NEGATIVE
Glucose, UA: NEGATIVE mg/dL
Hgb urine dipstick: NEGATIVE
Ketones, ur: NEGATIVE mg/dL
Leukocytes,Ua: NEGATIVE
Nitrite: NEGATIVE
Protein, ur: NEGATIVE mg/dL
Specific Gravity, Urine: 1.01 (ref 1.005–1.030)
pH: 5 (ref 5.0–8.0)

## 2022-07-03 LAB — LIPASE, BLOOD: Lipase: 29 U/L (ref 11–51)

## 2022-07-03 LAB — POC URINE PREG, ED: Preg Test, Ur: NEGATIVE

## 2022-07-03 MED ORDER — MELATONIN 5 MG PO TABS
5.0000 mg | ORAL_TABLET | Freq: Every evening | ORAL | Status: DC | PRN
  Filled 2022-07-03: qty 1

## 2022-07-03 MED ORDER — KETOROLAC TROMETHAMINE 30 MG/ML IJ SOLN
30.0000 mg | Freq: Four times a day (QID) | INTRAMUSCULAR | Status: DC
  Administered 2022-07-04 (×2): 30 mg via INTRAVENOUS
  Filled 2022-07-03 (×2): qty 1

## 2022-07-03 MED ORDER — INDOCYANINE GREEN 25 MG IV SOLR
2.5000 mg | Freq: Once | INTRAVENOUS | Status: AC
  Administered 2022-07-04: 2.5 mg via INTRAVENOUS
  Filled 2022-07-03: qty 10
  Filled 2022-07-03: qty 1

## 2022-07-03 MED ORDER — SODIUM CHLORIDE 0.9 % IV SOLN: INTRAVENOUS | Status: DC

## 2022-07-03 MED ORDER — SODIUM CHLORIDE 0.9 % IV SOLN
2.0000 g | INTRAVENOUS | Status: DC
  Administered 2022-07-03: 2 g via INTRAVENOUS
  Filled 2022-07-03 (×2): qty 20

## 2022-07-03 MED ORDER — METHOCARBAMOL 500 MG PO TABS: 500.0000 mg | ORAL_TABLET | Freq: Three times a day (TID) | ORAL | Status: DC | PRN

## 2022-07-03 MED ORDER — SODIUM CHLORIDE 0.9 % IV SOLN: 2.0000 g | INTRAVENOUS | Status: DC

## 2022-07-03 MED ORDER — ONDANSETRON HCL 4 MG/2ML IJ SOLN
4.0000 mg | Freq: Once | INTRAMUSCULAR | Status: AC
  Administered 2022-07-03: 4 mg via INTRAVENOUS
  Filled 2022-07-03: qty 2

## 2022-07-03 MED ORDER — VALACYCLOVIR HCL 500 MG PO TABS
500.0000 mg | ORAL_TABLET | Freq: Every day | ORAL | Status: DC
  Filled 2022-07-03 (×2): qty 1

## 2022-07-03 MED ORDER — ALUM & MAG HYDROXIDE-SIMETH 200-200-20 MG/5ML PO SUSP
30.0000 mL | Freq: Once | ORAL | Status: AC
Start: 2022-07-03 — End: 2022-07-03
  Administered 2022-07-03: 30 mL via ORAL
  Filled 2022-07-03: qty 30

## 2022-07-03 MED ORDER — ACETAMINOPHEN 500 MG PO TABS
1000.0000 mg | ORAL_TABLET | Freq: Four times a day (QID) | ORAL | Status: DC
  Administered 2022-07-03: 1000 mg via ORAL
  Filled 2022-07-03 (×4): qty 2

## 2022-07-03 MED ORDER — CHLORHEXIDINE GLUCONATE CLOTH 2 % EX PADS
6.0000 | MEDICATED_PAD | Freq: Every day | CUTANEOUS | Status: DC
Start: 2022-07-04 — End: 2022-07-04
  Administered 2022-07-04: 6 via TOPICAL
  Filled 2022-07-03: qty 6

## 2022-07-03 MED ORDER — OXYCODONE HCL 5 MG PO TABS
5.0000 mg | ORAL_TABLET | ORAL | Status: DC | PRN
  Administered 2022-07-04: 5 mg via ORAL
  Filled 2022-07-03: qty 1

## 2022-07-03 MED ORDER — ONDANSETRON HCL 4 MG/2ML IJ SOLN: 4.0000 mg | Freq: Four times a day (QID) | INTRAMUSCULAR | Status: DC | PRN

## 2022-07-03 MED ORDER — DIPHENHYDRAMINE HCL 12.5 MG/5ML PO ELIX: 12.5000 mg | ORAL_SOLUTION | Freq: Four times a day (QID) | ORAL | Status: DC | PRN

## 2022-07-03 MED ORDER — KETOROLAC TROMETHAMINE 15 MG/ML IJ SOLN
15.0000 mg | Freq: Once | INTRAMUSCULAR | Status: AC
  Administered 2022-07-03: 15 mg via INTRAVENOUS
  Filled 2022-07-03: qty 1

## 2022-07-03 MED ORDER — DIPHENHYDRAMINE HCL 50 MG/ML IJ SOLN: 12.5000 mg | Freq: Four times a day (QID) | INTRAMUSCULAR | Status: DC | PRN

## 2022-07-03 MED ORDER — MORPHINE SULFATE (PF) 4 MG/ML IV SOLN: 4.0000 mg | INTRAVENOUS | Status: DC | PRN

## 2022-07-03 MED ORDER — METHOCARBAMOL 1000 MG/10ML IJ SOLN
500.0000 mg | Freq: Three times a day (TID) | INTRAVENOUS | Status: DC | PRN
  Filled 2022-07-03: qty 5

## 2022-07-03 MED ORDER — HEPARIN SODIUM (PORCINE) 5000 UNIT/ML IJ SOLN
5000.0000 [IU] | Freq: Three times a day (TID) | INTRAMUSCULAR | Status: DC
  Filled 2022-07-03: qty 1

## 2022-07-03 MED ORDER — PANTOPRAZOLE SODIUM 40 MG IV SOLR
40.0000 mg | Freq: Every day | INTRAVENOUS | Status: DC
  Administered 2022-07-03: 40 mg via INTRAVENOUS
  Filled 2022-07-03: qty 10

## 2022-07-03 MED ORDER — ONDANSETRON 4 MG PO TBDP: 4.0000 mg | ORAL_TABLET | Freq: Four times a day (QID) | ORAL | Status: DC | PRN

## 2022-07-03 MED ORDER — MORPHINE SULFATE (PF) 4 MG/ML IV SOLN: 4.0000 mg | Freq: Once | INTRAVENOUS | Status: DC

## 2022-07-03 NOTE — H&P (Signed)
[name], female   DOB: 11/25/1986, 36 y.o.   MRN: [id number]  HPI Adrienne Hall is a 36 y.o. female seen in consultation at the request of Ms. Poggi PA-C.  She presents to the emergency room complaining of worsening right upper quadrant pain and epigastric pain that started about 3 days ago associated with nausea and vomiting.  She states that she has been having intermittent pain for the last 10 days or so but over the last few days have been definitely worsening.  She also reports nausea and vomiting.  The pain is moderate to severe in intensity and sharp in nature.  Apparently she feels that the pain is worsened with certain meals.  She also reports that pain is worse on deep inspiration.  She denies any fevers and chills no evidence of biliary obstruction.  She did have an ultrasound that I have personally reviewed showing evidence of early cholecystitis with the gallbladder stone at the neck.  Normal common bile duct.  CBC and CMP is completely normal.  She does have a prior history of PTSD anxiety and herpes. She does have a prior history of C-section.  She is able to perform more than 4 METS of activity without any shortness of breath or chest pain.  Positive history of gallbladder issues and one of the cousins  HPI  Past Medical History:  Diagnosis Date   Family history of breast cancer    Family history of cancer in brother 04/13/2021   Melanoma, soft tissue sarcoma   Family history of melanoma    Family history of multiple myeloma    Herpes simplex    Mental disorder    "Borderline Personality Disorder"   PTSD (post-traumatic stress disorder) 05/25/2022    Past Surgical History:  Procedure Laterality Date   CESAREAN SECTION     WISDOM TOOTH EXTRACTION      Family History  Problem Relation Age of Onset   Diabetes Maternal Grandmother    Hypertension Maternal Grandmother    Hyperlipidemia Maternal Grandmother    Alzheimer's disease Maternal Grandmother    Stroke  Maternal Grandmother 73   Heart disease Maternal Grandfather    Hypertension Maternal Grandfather    Breast cancer Paternal Grandmother 31   Kidney disease Paternal Grandfather    Healthy Mother    Healthy Father    Diabetes Brother        associated with cancer   Melanoma Brother    Other Brother        soft tissue sarcoma; melanoma   Hypertension Brother    Hyperlipidemia Brother    Other Brother        hx brain tumor, was removed   Multiple sclerosis Brother    Multiple myeloma Maternal Aunt    Epilepsy Maternal Aunt    Breast cancer Maternal Great-grandmother 90    Social History Social History   Tobacco Use   Smoking status: Never   Smokeless tobacco: Never  Vaping Use   Vaping Use: Never used  Substance Use Topics   Alcohol use: Yes    Alcohol/week: 1.0 standard drink of alcohol    Types: 1 Glasses of wine per week    Comment: once a week, glass of wine   Drug use: No    Allergies  Allergen Reactions   Penicillins Hives, Swelling and Rash    Current Facility-Administered Medications  Medication Dose Route Frequency Provider Last Rate Last Admin   0.9 %  sodium chloride  infusion   Intravenous Continuous Snow Peoples, [place] F, MD       acetaminophen (TYLENOL) tablet 1,000 mg  1,000 mg Oral Q6H Nahiem Dredge F, MD   1,000 mg at 07/03/22 2206   cefTRIAXone (ROCEPHIN) 2 g in sodium chloride 0.9 % 100 mL IVPB  2 g Intravenous Q24H Jules Husbands, MD   Stopped at 07/03/22 1955   [START ON 07/04/2022] Chlorhexidine Gluconate Cloth 2 % PADS 6 each  6 each Topical Q0600 Kailana Benninger F, MD       diphenhydrAMINE (BENADRYL) 12.5 MG/5ML elixir 12.5 mg  12.5 mg Oral Q6H PRN Damean Poffenberger F, MD       Or   diphenhydrAMINE (BENADRYL) injection 12.5 mg  12.5 mg Intravenous Q6H PRN Milford Cilento, Marjory Lies, MD       [START ON 07/04/2022] heparin injection 5,000 Units  5,000 Units Subcutaneous Q8H Leanard Dimaio F, MD       [START ON 07/04/2022] indocyanine green (IC-GREEN) injection 2.5 mg  2.5 mg  Intravenous Once Dorri Ozturk, Marjory Lies, MD       [START ON 07/04/2022] ketorolac (TORADOL) 30 MG/ML injection 30 mg  30 mg Intravenous Q6H Craigory Toste F, MD       melatonin tablet 5 mg  5 mg Oral QHS PRN Fathima Bartl F, MD       methocarbamol (ROBAXIN) tablet 500 mg  500 mg Oral Q8H PRN Mordechai Matuszak F, MD       Or   methocarbamol (ROBAXIN) 500 mg in dextrose 5 % 50 mL IVPB  500 mg Intravenous Q8H PRN Christionna Poland F, MD       morphine (PF) 4 MG/ML injection 4 mg  4 mg Intravenous Q4H PRN Esme Freund F, MD       ondansetron (ZOFRAN-ODT) disintegrating tablet 4 mg  4 mg Oral Q6H PRN Rylinn Linzy F, MD       Or   ondansetron (ZOFRAN) injection 4 mg  4 mg Intravenous Q6H PRN Tylah Mancillas, Lanett Lasorsa F, MD       oxyCODONE (Oxy IR/ROXICODONE) immediate release tablet 5-10 mg  5-10 mg Oral Q4H PRN Marilla Boddy F, MD       pantoprazole (PROTONIX) injection 40 mg  40 mg Intravenous QHS Aleiya Rye, [place] F, MD   40 mg at 07/03/22 2205   [START ON 07/04/2022] valACYclovir (VALTREX) tablet 500 mg  500 mg Oral Daily Birtie Fellman F, MD       Current Outpatient Medications  Medication Sig Dispense Refill   FLUoxetine (PROZAC) 40 MG capsule Take 1 capsule (40 mg total) by mouth daily. 90 capsule 3   methylPREDNISolone (MEDROL DOSEPAK) 4 MG TBPK tablet Take per package instructions 21 tablet 0   Multiple Vitamin (MULTIVITAMIN) tablet Take 1 tablet by mouth daily.     valACYclovir (VALTREX) 500 MG tablet Take 1 tablet (500 mg total) by mouth daily. For herpes prevention. 90 tablet 3     Review of Systems Full ROS  was asked and was negative except for the information on the HPI  Physical Exam Blood pressure 128/80, pulse 70, temperature 98.9 F (37.2 C), temperature source Oral, resp. rate 16, height 5' 6"  (1.676 m), weight 79.1 kg, SpO2 98 %. CONSTITUTIONAL: NAD. EYES: Pupils are equal, round,  Sclera are non-icteric. EARS, NOSE, MOUTH AND THROAT: . The oral mucosa is pink and moist. Hearing is intact to voice. LYMPH NODES:   Lymph nodes in the neck are normal. RESPIRATORY:  Lungs are clear. There is  normal respiratory effort, with equal breath sounds bilaterally, and without pathologic use of accessory muscles. CARDIOVASCULAR: Heart is regular without murmurs, gallops, or rubs. GI: The abdomen is  soft, TTP RUQ w Positive Murphy sign.  No peritonitis. There are no palpable masses. There is no hepatosplenomegaly. There are normal bowel sounds  GU: Rectal deferred.   MUSCULOSKELETAL: Normal muscle strength and tone. No cyanosis or edema.   SKIN: Turgor is good and there are no pathologic skin lesions or ulcers. NEUROLOGIC: Motor and sensation is grossly normal. Cranial nerves are grossly intact. PSYCH:  Oriented to person, place and time. Affect is normal.  Data Reviewed  I have personally reviewed the patient's imaging, laboratory findings and medical records.    Assessment/Plan 36 year old female with clinical findings consistent with acute cholecystitis.  Ultrasound findings shows early cholecystitis but clinical findings definitely confirmed suspicious diagnosis.  Discussed with the patient in detail about her condition.  I definitely will recommend admission with hospitalization and initiation of broad-spectrum antibiotics IV fluid resuscitation and prompt cholecystectomy.  We will plan to do cholecystectomy tomorrow.  I Do think that robotic approach is perfectly suitable for her. The risks, benefits, complications, treatment options, and expected outcomes were discussed with the patient. The possibilities of bleeding, recurrent infection, finding a normal gallbladder, perforation of viscus organs, damage to surrounding structures, bile leak, abscess formation, needing a drain placed, the need for additional procedures, reaction to medication, pulmonary aspiration,  failure to diagnose a condition, the possible need to convert to an open procedure, and creating a complication requiring transfusion or operation were  discussed with the patient. The patient and/or family concurred with the proposed plan, giving informed consent.   Please note that I spent 75 minutes in this encounter including coordination of her care, personally reviewing imaging studies, counseling the patient, placing orders and performing appropriate documentation.  Caroleen Hamman, MD FACS General Surgeon 07/03/2022, 10:10 PM

## 2022-07-03 NOTE — Telephone Encounter (Signed)
Adrienne Hall - Client TELEPHONE ADVICE RECORD AccessNurse Patient Name: Adrienne Hall IS Gender: Female DOB: June 13, 1986 Age: 36 Y 25 D Return Phone Number: [phone] (Primary) Address: City/ State/ Zip: [place]  [address] Client [facility] - Client Client Site [place] [facility] Provider AA - PHYSICIAN, NOT LISTED- MD Contact Type Call Who Is Calling Patient / Member / Family / Caregiver Call Type Triage / Clinical Relationship To Patient Self Return Phone Number [phone] (Primary) Chief Complaint Vomiting Reason for Call Symptomatic / Request for [name] states she has had pain under her breast and rib. She is vomiting. It hurts to breath or lay down. Provider is [employer]. Translation No Nurse Assessment Nurse: Self, RN, Nira Conn Date/Time (Eastern Time): 07/03/2022 4:45:16 PM Confirm and document reason for call. If symptomatic, describe symptoms. ---Caller has pain under breast in rib and back, vomiting. Caller no fever or diarrhea. Does the patient have any new or worsening symptoms? ---Yes Will a triage be completed? ---Yes Related visit to physician within the last 2 weeks? ---No Does the PT have any chronic conditions? (i.e. diabetes, asthma, this includes High risk factors for pregnancy, etc.) ---No Is the patient pregnant or possibly pregnant? (Ask all females between the ages of 46-55) ---No Is this a behavioral health or substance abuse call? ---No Guidelines Guideline Title Affirmed Question Affirmed Notes Nurse Date/Time Eilene Ghazi Time) Abdominal Pain - Upper [1] SEVERE pain (e.g., excruciating) AND [2] present > 1 hour Self, RN, Heather 07/03/2022 4:46:38 PM Disp. Time Eilene Ghazi Time) Disposition Final User 07/03/2022 4:43:34 PM Attempt made - message left Self, RN, Nira Conn 07/03/2022 4:48:25 PM Go to ED Now Yes Self, RN,  Nira Conn PLEASE NOTE: All timestamps contained within this report are represented as [place] Standard Time. CONFIDENTIALTY NOTICE: This fax transmission is intended only for the addressee. It contains information that is legally privileged, confidential or otherwise protected from use or disclosure. If you are not the intended recipient, you are strictly prohibited from reviewing, disclosing, copying using or disseminating any of this information or taking any action in reliance on or regarding this information. If you have received this fax in error, please notify us immediately by telephone so that we can arrange for its return to [place]. Phone: [phone], Toll-Free: [phone], Fax: [phone] Page: 2 of 2 Call Id: [id number] Final Disposition 07/03/2022 4:48:25 PM Go to ED Now Yes Self, RN, Soyla Murphy Disagree/Comply Comply Caller Understands Yes PreDisposition Call Doctor Care Advice Given Per Guideline GO TO ED NOW: * Leave now. Drive carefully. * You need to be seen in the Emergency Department. CARE ADVICE given per Abdominal Pain - Upper (Adult) guideline. * Reason: Condition may need surgery and general anesthesia. * Do not eat or drink anything for now. NOTHING BY MOUTH: NOTE TO TRIAGER - DRIVING: * Another adult should drive. Comments User: Robynn Pane, RN Date/Time Eilene Ghazi Time): 07/03/2022 4:49:41 PM Caller says pain is constant and 8/10 . Referrals [place]

## 2022-07-03 NOTE — ED Triage Notes (Signed)
Pt having abdominal pain onset this weekend worse after she eats. Patient endorses N/V last episode of emesis last night.

## 2022-07-03 NOTE — Telephone Encounter (Addendum)
Per access nurse note pt agreed to go to ED for eval. Sending note to T Dugal FNP and Claiborne Billings CMA. I spoke with pt and she is on way now to [facility] ED.

## 2022-07-03 NOTE — ED Provider Notes (Signed)
[facility] Provider Note    Event Date/Time   First MD Initiated Contact with Patient 07/03/22 1827     (approximate)   History   Abdominal Pain   HPI  Adrienne Hall is a 36 y.o. female with with a past medical history of hyperlipidemia, obesity, adjustment disorder who presents today for evaluation of right upper quadrant and epigastric abdominal pain for the past 2 to 3 days with associated nausea and vomiting.  She feels that her pain is worsened after eating.  She denies fever, reports some chills.  She denies chest pain or shortness of breath, though does report that she has pain in her abdomen when she takes deep breaths.  Patient Active Problem List   Diagnosis Date Noted   Cholecystitis 07/03/2022   PTSD (post-traumatic stress disorder) 05/25/2022   Weakness of left leg 04/26/2022   Other fatigue 04/25/2022   Encounter for general adult medical examination with abnormal findings 04/25/2022   Elevated lipids 04/25/2022   Numbness in left leg 04/25/2022   Acute left-sided low back pain with left-sided sciatica 04/25/2022   Adult BMI 29.0-29.9 kg/sq m 04/25/2022   Overweight (BMI 25.0-29.9) 04/25/2022   Adjustment disorder with mixed anxiety and depressed mood 01/24/2022   Atypical mole 04/13/2021   Depression with anxiety 04/01/2019   History of herpes genitalis 11/25/2015          Physical Exam   Triage Vital Signs: ED Triage Vitals  Enc Vitals Group     BP 07/03/22 1747 128/80     Pulse Rate 07/03/22 1747 70     Resp 07/03/22 1747 16     Temp 07/03/22 1747 98.9 F (37.2 C)     Temp Source 07/03/22 1747 Oral     SpO2 07/03/22 1747 98 %     Weight 07/03/22 1747 174 lb 6.4 oz (79.1 kg)     Height 07/03/22 1747 '5\' 6"'$  (1.676 m)     Head Circumference --      Peak Flow --      Pain Score 07/03/22 1803 10     Pain Loc --      Pain Edu? --      Excl. in [place]? --     Most recent vital signs: Vitals:   07/03/22 1747  BP: 128/80   Pulse: 70  Resp: 16  Temp: 98.9 F (37.2 C)  SpO2: 98%    Physical Exam Vitals and nursing note reviewed.  Constitutional:      General: Awake and alert. No acute distress.    Appearance: Normal appearance. The patient is normal weight.  HENT:     Head: Normocephalic and atraumatic.     Mouth: Mucous membranes are moist.  Eyes:     General: PERRL. Normal EOMs        Right eye: No discharge.        Left eye: No discharge.     Conjunctiva/sclera: Conjunctivae normal.  Cardiovascular:     Rate and Rhythm: Normal rate and regular rhythm.     Pulses: Normal pulses.     Heart sounds: Normal heart sounds Pulmonary:     Effort: Pulmonary effort is normal. No respiratory distress.     Breath sounds: Normal breath sounds.  Abdominal:     Abdomen is soft. There is epigastric and right upper quadrant abdominal tenderness.  Pain with Murphy sign.  No rebound or guarding. No distention.  No lower abdominal tenderness. Musculoskeletal:  General: No swelling. Normal range of motion.     Cervical back: Normal range of motion and neck supple.  Skin:    General: Skin is warm and dry.     Capillary Refill: Capillary refill takes less than 2 seconds.     Findings: No rash.  Neurological:     Mental Status: The patient is awake and alert.      ED Results / Procedures / Treatments   Labs (all labs ordered are listed, but only abnormal results are displayed) Labs Reviewed  COMPREHENSIVE METABOLIC PANEL - Abnormal; Notable for the following components:      Result Value   Glucose, Bld 129 (*)    AST 13 (*)    All other components within normal limits  URINALYSIS, ROUTINE W REFLEX MICROSCOPIC - Abnormal; Notable for the following components:   Color, Urine STRAW (*)    APPearance HAZY (*)    All other components within normal limits  CBC WITH DIFFERENTIAL/PLATELET  LIPASE, BLOOD  HIV ANTIBODY (ROUTINE TESTING W REFLEX)  POC URINE PREG, ED     EKG     RADIOLOGY I  independently reviewed and interpreted imaging and agree with radiologists findings.     PROCEDURES:  Critical Care performed:   Procedures   MEDICATIONS ORDERED IN ED: Medications  cefTRIAXone (ROCEPHIN) 2 g in sodium chloride 0.9 % 100 mL IVPB (2 g Intravenous New Bag/Given 07/03/22 1925)  valACYclovir (VALTREX) tablet 500 mg (has no administration in time range)  heparin injection 5,000 Units (has no administration in time range)  0.9 %  sodium chloride infusion (has no administration in time range)  cefTRIAXone (ROCEPHIN) 2 g in sodium chloride 0.9 % 100 mL IVPB (has no administration in time range)  acetaminophen (TYLENOL) tablet 1,000 mg (has no administration in time range)  morphine (PF) 4 MG/ML injection 4 mg (has no administration in time range)  oxyCODONE (Oxy IR/ROXICODONE) immediate release tablet 5-10 mg (has no administration in time range)  ketorolac (TORADOL) 30 MG/ML injection 30 mg (has no administration in time range)  diphenhydrAMINE (BENADRYL) 12.5 MG/5ML elixir 12.5 mg (has no administration in time range)    Or  diphenhydrAMINE (BENADRYL) injection 12.5 mg (has no administration in time range)  melatonin tablet 3 mg (has no administration in time range)  methocarbamol (ROBAXIN) tablet 500 mg (has no administration in time range)    Or  methocarbamol (ROBAXIN) 500 mg in dextrose 5 % 50 mL IVPB (has no administration in time range)  ondansetron (ZOFRAN-ODT) disintegrating tablet 4 mg (has no administration in time range)    Or  ondansetron (ZOFRAN) injection 4 mg (has no administration in time range)  pantoprazole (PROTONIX) injection 40 mg (has no administration in time range)  Chlorhexidine Gluconate Cloth 2 % PADS 6 each (has no administration in time range)  indocyanine green (IC-GREEN) injection 2.5 mg (has no administration in time range)  alum & mag hydroxide-simeth (MAALOX/MYLANTA) 200-200-20 MG/5ML suspension 30 mL (30 mLs Oral Given 07/03/22 1903)   ondansetron (ZOFRAN) injection 4 mg (4 mg Intravenous Given 07/03/22 1901)  ketorolac (TORADOL) 15 MG/ML injection 15 mg (15 mg Intravenous Given 07/03/22 1901)     IMPRESSION / MDM / ASSESSMENT AND PLAN / ED COURSE  I reviewed the triage vital signs and the nursing notes.   Differential diagnosis includes, but is not limited to, biliary colic, cholecystitis, choledocholithiasis, ascending cholangitis, pancreatitis, gastritis, peptic ulcer disease, less likely appendicitis, diverticulitis, UTI/pyelonephritis, ectopic pregnancy.  Patient presents emergency  department awake and alert, hemodynamically stable and afebrile.  She has right upper quadrant abdominal tenderness, no rebound or guarding.  She underwent laboratory testing in triage which is overall reassuring with no LFT dysfunction, no leukocytosis, normal lipase.  Urinalysis without UTI, pregnancy is negative.  Patient was treated symptomatically with GI cocktail, toradol, Zofran.  Right upper quadrant ultrasound reviewed by me demonstrates gallstones with a large stone at the gallbladder neck.  Ultrasound read by the radiologist reveals impacted stones in the gallbladder neck with cholelithiasis and mild gallbladder wall thickening without pericholecystic fluid.  Findings were discussed with Dr. Dahlia Byes who has agreed to consult.  He recommends Zosyn empirically which he changed to Rocephin due to PCN allergy.   Patient admitted to the surgical service with plans for cholecystectomy.    Patient's presentation is most consistent with acute presentation with potential threat to life or bodily function.   Clinical Course as of 07/03/22 1927  Tue Jul 03, 2022  1914 Dr. Dahlia Byes consulted, will see patient in ED and likely admit [JP]    Clinical Course User Index [JP] Meshilem Machuca, Clarnce Flock, PA-C     FINAL CLINICAL IMPRESSION(S) / ED DIAGNOSES   Final diagnoses:  Cholecystitis     Rx / DC Orders   ED Discharge Orders     None         Note:  This document was prepared using Dragon voice recognition software and may include unintentional dictation errors.   Emeline Gins 07/03/22 1927    Lucrezia Starch, MD 07/03/22 2107

## 2022-07-03 NOTE — ED Provider Triage Note (Signed)
Emergency Medicine Provider Triage Evaluation Note  Adrienne Hall , a 36 y.o. female  was evaluated in triage.  Pt complains of right upper quadrant abdominal pain and epigastric abdominal pain for the past 2 to 3 days with associated nausea and vomiting.  Patient reports that pain is worse after eating.  She has had some chills but no fever.  She denies chest pain or shortness of breath.  Patient has Nexplanon and states that her chance of pregnancy is low.  She denies dysuria, hematuria or increased urinary frequency.  Review of Systems  Positive: Patient has right upper quadrant abdominal pain.  Negative: No chest pain or SOB.   Physical Exam  BP 128/80 (BP Location: Left Arm)   Pulse 70   Temp 98.9 F (37.2 C) (Oral)   Resp 16   Ht '5\' 6"'$  (1.676 m)   Wt 79.1 kg   SpO2 98%   BMI 28.15 kg/m  Gen:   Awake, no distress   Resp:  Normal effort  MSK:   Moves extremities without difficulty  Other:    Medical Decision Making  Medically screening exam initiated at 6:02 PM.  Appropriate orders placed.  Martha Clan was informed that the remainder of the evaluation will be completed by another provider, this initial triage assessment does not replace that evaluation, and the importance of remaining in the ED until their evaluation is complete.     Vallarie Mare [place], [place] 07/03/22 1804

## 2022-07-04 ENCOUNTER — Other Ambulatory Visit: Payer: Self-pay

## 2022-07-04 ENCOUNTER — Observation Stay: Payer: No Typology Code available for payment source | Admitting: Anesthesiology

## 2022-07-04 ENCOUNTER — Encounter: Payer: Self-pay | Admitting: Surgery

## 2022-07-04 ENCOUNTER — Encounter
Admission: EM | Disposition: A | Discharge status: Home / Self Care | Payer: Self-pay | Source: Home / Self Care | Attending: Emergency Medicine

## 2022-07-04 DIAGNOSIS — K810 Acute cholecystitis: Secondary | ICD-10-CM | POA: Diagnosis not present

## 2022-07-04 LAB — HIV ANTIBODY (ROUTINE TESTING W REFLEX): HIV Screen 4th Generation wRfx: NONREACTIVE

## 2022-07-04 SURGERY — CHOLECYSTECTOMY, ROBOT-ASSISTED, LAPAROSCOPIC
Anesthesia: General | Site: Abdomen

## 2022-07-04 MED ORDER — FENTANYL CITRATE (PF) 100 MCG/2ML IJ SOLN
INTRAMUSCULAR | Status: AC
  Filled 2022-07-04: qty 2

## 2022-07-04 MED ORDER — LACTATED RINGERS IV SOLN: INTRAVENOUS | Status: DC | PRN

## 2022-07-04 MED ORDER — DEXAMETHASONE SODIUM PHOSPHATE 10 MG/ML IJ SOLN
INTRAMUSCULAR | Status: DC | PRN
  Administered 2022-07-04: 10 mg via INTRAVENOUS

## 2022-07-04 MED ORDER — FENTANYL CITRATE (PF) 100 MCG/2ML IJ SOLN
INTRAMUSCULAR | Status: DC | PRN
  Administered 2022-07-04 (×2): 50 ug via INTRAVENOUS

## 2022-07-04 MED ORDER — ROCURONIUM BROMIDE 100 MG/10ML IV SOLN
INTRAVENOUS | Status: DC | PRN
  Administered 2022-07-04: 30 mg via INTRAVENOUS
  Administered 2022-07-04: 40 mg via INTRAVENOUS

## 2022-07-04 MED ORDER — OXYCODONE HCL 5 MG PO TABS: 5.0000 mg | ORAL_TABLET | Freq: Once | ORAL | Status: DC | PRN

## 2022-07-04 MED ORDER — ONDANSETRON HCL 4 MG/2ML IJ SOLN
INTRAMUSCULAR | Status: DC | PRN
  Administered 2022-07-04: 4 mg via INTRAVENOUS

## 2022-07-04 MED ORDER — FENTANYL CITRATE (PF) 100 MCG/2ML IJ SOLN
INTRAMUSCULAR | Status: AC
  Administered 2022-07-04: 50 ug via INTRAVENOUS
  Filled 2022-07-04: qty 2

## 2022-07-04 MED ORDER — 0.9 % SODIUM CHLORIDE (POUR BTL) OPTIME
TOPICAL | Status: DC | PRN
  Administered 2022-07-04: 500 mL

## 2022-07-04 MED ORDER — BUPIVACAINE-EPINEPHRINE (PF) 0.25% -1:200000 IJ SOLN
INTRAMUSCULAR | Status: AC
  Filled 2022-07-04: qty 30

## 2022-07-04 MED ORDER — MIDAZOLAM HCL 2 MG/2ML IJ SOLN
INTRAMUSCULAR | Status: AC
  Filled 2022-07-04: qty 2

## 2022-07-04 MED ORDER — ONDANSETRON HCL 4 MG/2ML IJ SOLN: 4.0000 mg | Freq: Once | INTRAMUSCULAR | Status: DC | PRN

## 2022-07-04 MED ORDER — FENTANYL CITRATE (PF) 100 MCG/2ML IJ SOLN
25.0000 ug | INTRAMUSCULAR | Status: DC | PRN
  Administered 2022-07-04 (×2): 50 ug via INTRAVENOUS

## 2022-07-04 MED ORDER — ACETAMINOPHEN 10 MG/ML IV SOLN
INTRAVENOUS | Status: AC
  Filled 2022-07-04: qty 100

## 2022-07-04 MED ORDER — BUPIVACAINE-EPINEPHRINE 0.25% -1:200000 IJ SOLN
INTRAMUSCULAR | Status: DC | PRN
  Administered 2022-07-04: 50 mL

## 2022-07-04 MED ORDER — PROPOFOL 10 MG/ML IV BOLUS
INTRAVENOUS | Status: DC | PRN
  Administered 2022-07-04: 150 mg via INTRAVENOUS

## 2022-07-04 MED ORDER — LIDOCAINE HCL (CARDIAC) PF 100 MG/5ML IV SOSY
PREFILLED_SYRINGE | INTRAVENOUS | Status: DC | PRN
  Administered 2022-07-04: 80 mg via INTRAVENOUS

## 2022-07-04 MED ORDER — ACETAMINOPHEN 10 MG/ML IV SOLN
INTRAVENOUS | Status: DC | PRN
  Administered 2022-07-04: 1000 mg via INTRAVENOUS

## 2022-07-04 MED ORDER — LACTATED RINGERS IV SOLN: INTRAVENOUS | Status: DC

## 2022-07-04 MED ORDER — ACETAMINOPHEN 10 MG/ML IV SOLN: 1000.0000 mg | Freq: Once | INTRAVENOUS | Status: DC | PRN

## 2022-07-04 MED ORDER — MIDAZOLAM HCL 2 MG/2ML IJ SOLN
INTRAMUSCULAR | Status: DC | PRN
  Administered 2022-07-04: 2 mg via INTRAVENOUS

## 2022-07-04 MED ORDER — BUPIVACAINE LIPOSOME 1.3 % IJ SUSP
INTRAMUSCULAR | Status: AC
  Filled 2022-07-04: qty 20

## 2022-07-04 MED ORDER — HYDROCODONE-ACETAMINOPHEN 5-325 MG PO TABS: 1.0000 | ORAL_TABLET | Freq: Four times a day (QID) | ORAL | 0 refills | Status: DC | PRN

## 2022-07-04 MED ORDER — OXYCODONE HCL 5 MG/5ML PO SOLN: 5.0000 mg | Freq: Once | ORAL | Status: DC | PRN

## 2022-07-04 MED ORDER — SUGAMMADEX SODIUM 200 MG/2ML IV SOLN
INTRAVENOUS | Status: DC | PRN
  Administered 2022-07-04: 200 mg via INTRAVENOUS

## 2022-07-04 SURGICAL SUPPLY — 39 items
CANNULA REDUC XI 12-8 STAPL (CANNULA) ×1
CANNULA REDUCER 12-8 DVNC XI (CANNULA) ×2 IMPLANT
CLIP LIGATING HEMO O LOK GREEN (MISCELLANEOUS) ×3 IMPLANT
DERMABOND ADVANCED (GAUZE/BANDAGES/DRESSINGS) ×1
DERMABOND ADVANCED .7 DNX12 (GAUZE/BANDAGES/DRESSINGS) ×2 IMPLANT
DRAPE ARM DVNC X/XI (DISPOSABLE) ×8 IMPLANT
DRAPE COLUMN DVNC XI (DISPOSABLE) ×2 IMPLANT
DRAPE DA VINCI XI ARM (DISPOSABLE) ×4
DRAPE DA VINCI XI COLUMN (DISPOSABLE) ×1
ELECT CAUTERY BLADE 6.4 (BLADE) ×3 IMPLANT
ELECT REM PT RETURN 9FT ADLT (ELECTROSURGICAL) ×3
ELECTRODE REM PT RTRN 9FT ADLT (ELECTROSURGICAL) ×2 IMPLANT
GLOVE BIO SURGEON STRL SZ7 (GLOVE) ×6 IMPLANT
GOWN STRL REUS W/ TWL LRG LVL3 (GOWN DISPOSABLE) ×8 IMPLANT
GOWN STRL REUS W/TWL LRG LVL3 (GOWN DISPOSABLE) ×4
IRRIGATION STRYKERFLOW (MISCELLANEOUS) IMPLANT
IRRIGATOR STRYKERFLOW (MISCELLANEOUS)
KIT PINK PAD W/HEAD ARE REST (MISCELLANEOUS) ×3 IMPLANT
KIT PINK PAD W/HEAD ARM REST (MISCELLANEOUS) ×2 IMPLANT
LABEL OR SOLS (LABEL) ×3 IMPLANT
MANIFOLD NEPTUNE II (INSTRUMENTS) ×3 IMPLANT
NEEDLE HYPO 22GX1.5 SAFETY (NEEDLE) ×3 IMPLANT
NS IRRIG 500ML POUR BTL (IV SOLUTION) ×3 IMPLANT
OBTURATOR OPTICAL STANDARD 8MM (TROCAR) ×1
OBTURATOR OPTICAL STND 8 DVNC (TROCAR) ×2
OBTURATOR OPTICALSTD 8 DVNC (TROCAR) ×2 IMPLANT
PACK LAP CHOLECYSTECTOMY (MISCELLANEOUS) ×3 IMPLANT
SEAL CANN UNIV 5-8 DVNC XI (MISCELLANEOUS) ×6 IMPLANT
SEAL XI 5MM-8MM UNIVERSAL (MISCELLANEOUS) ×3
SET TUBE SMOKE EVAC HIGH FLOW (TUBING) ×3 IMPLANT
SOLUTION ELECTROLUBE (MISCELLANEOUS) ×3 IMPLANT
SPONGE T-LAP 18X18 ~~LOC~~+RFID (SPONGE) ×3 IMPLANT
STAPLER CANNULA SEAL DVNC XI (STAPLE) ×2 IMPLANT
STAPLER CANNULA SEAL XI (STAPLE) ×1
SUT MNCRL AB 4-0 PS2 18 (SUTURE) ×3 IMPLANT
SUT VICRYL 0 AB UR-6 (SUTURE) ×6 IMPLANT
SYR 20ML LL LF (SYRINGE) ×3 IMPLANT
SYS BAG RETRIEVAL 10MM (BASKET) ×3
SYSTEM BAG RETRIEVAL 10MM (BASKET) ×2 IMPLANT

## 2022-07-04 NOTE — Telephone Encounter (Signed)
Yes great thank you! Agree with recommendations, ER for sure. Thank you.

## 2022-07-04 NOTE — Anesthesia Preprocedure Evaluation (Addendum)
Anesthesia Evaluation  Patient identified by MRN, date of birth, ID band Patient awake    Reviewed: Allergy & Precautions, NPO status , Patient's Chart, lab work & pertinent test results  History of Anesthesia Complications Negative for: history of anesthetic complications  Airway Mallampati: I   Neck ROM: Full    Dental no notable dental hx.    Pulmonary neg pulmonary ROS,    Pulmonary exam normal breath sounds clear to auscultation       Cardiovascular Exercise Tolerance: Good Normal cardiovascular exam Rhythm:Regular Rate:Normal  ECG 07/03/22:  Normal sinus rhythm Nonspecific T wave abnormality   Neuro/Psych PSYCHIATRIC DISORDERS (PTSD, borderline personality disorder) Anxiety Depression negative neurological ROS     GI/Hepatic negative GI ROS,   Endo/Other  negative endocrine ROS  Renal/GU negative Renal ROS     Musculoskeletal   Abdominal   Peds  Hematology negative hematology ROS (+)   Anesthesia Other Findings   Reproductive/Obstetrics                            Anesthesia Physical Anesthesia Plan  ASA: 2 and emergent  Anesthesia Plan: General   Post-op Pain Management:    Induction: Intravenous and Rapid sequence  PONV Risk Score and Plan: 3 and Ondansetron, Dexamethasone and Treatment may vary due to age or medical condition  Airway Management Planned: Oral ETT  Additional Equipment:   Intra-op Plan:   Post-operative Plan: Extubation in OR  Informed Consent: I have reviewed the patients History and Physical, chart, labs and discussed the procedure including the risks, benefits and alternatives for the proposed anesthesia with the patient or authorized representative who has indicated his/her understanding and acceptance.     Dental advisory given  Plan Discussed with: CRNA  Anesthesia Plan Comments: (Patient consented for risks of anesthesia including but not  limited to:  - adverse reactions to medications - damage to eyes, teeth, lips or other oral mucosa - nerve damage due to positioning  - sore throat or hoarseness - damage to heart, brain, nerves, lungs, other parts of body or loss of life  Informed patient about role of CRNA in peri- and intra-operative care.  Patient voiced understanding.)       Anesthesia Quick Evaluation

## 2022-07-04 NOTE — Anesthesia Procedure Notes (Signed)
Procedure Name: Intubation Date/Time: 07/04/2022 12:41 PM  Performed by: Aline Brochure, CRNAPre-anesthesia Checklist: Patient identified, Patient being monitored, Timeout performed, Emergency Drugs available and Suction available Patient Re-evaluated:Patient Re-evaluated prior to induction Oxygen Delivery Method: Circle system utilized Preoxygenation: Pre-oxygenation with 100% oxygen Induction Type: IV induction Ventilation: Mask ventilation without difficulty Laryngoscope Size: Mac, 3 and McGraph Grade View: Grade III Tube type: Oral Tube size: 7.0 mm Number of attempts: 1 Airway Equipment and Method: Stylet and Bougie stylet Placement Confirmation: ETT inserted through vocal cords under direct vision, positive ETCO2 and breath sounds checked- equal and bilateral Secured at: 21 cm Tube secured with: Tape Dental Injury: Teeth and Oropharynx as per pre-operative assessment

## 2022-07-04 NOTE — ED Notes (Signed)
Patient resting comfortably at this time. NAD noted. Respirations even and unlabored. No needs expressed to RN.

## 2022-07-04 NOTE — Transfer of Care (Signed)
Immediate Anesthesia Transfer of Care Note  Patient: Adrienne Hall  Procedure(s) Performed: XI ROBOTIC ASSISTED LAPAROSCOPIC CHOLECYSTECTOMY (Abdomen) INDOCYANINE GREEN FLUORESCENCE IMAGING (ICG)  Patient Location: PACU  Anesthesia Type:General  Level of Consciousness: sedated  Airway & Oxygen Therapy: Patient Spontanous Breathing and Patient connected to face mask oxygen  Post-op Assessment: Report given to RN and Post -op Vital signs reviewed and stable  Post vital signs: Reviewed and stable  Last Vitals:  Vitals Value Taken Time  BP 102/59   Temp    Pulse 83 07/04/22 1332  Resp 20 07/04/22 1332  SpO2 100 % 07/04/22 1332  Vitals shown include unvalidated device data.  Last Pain:  Vitals:   07/04/22 1133  TempSrc: Oral  PainSc: 1          Complications: No notable events documented.

## 2022-07-04 NOTE — Anesthesia Postprocedure Evaluation (Signed)
Anesthesia Post Note  Patient: Martha Clan  Procedure(s) Performed: XI ROBOTIC ASSISTED LAPAROSCOPIC CHOLECYSTECTOMY (Abdomen) INDOCYANINE GREEN FLUORESCENCE IMAGING (ICG)  Patient location during evaluation: PACU Anesthesia Type: General Level of consciousness: awake and alert, oriented and patient cooperative Pain management: pain level controlled Vital Signs Assessment: post-procedure vital signs reviewed and stable Respiratory status: spontaneous breathing, nonlabored ventilation and respiratory function stable Cardiovascular status: blood pressure returned to baseline and stable Postop Assessment: adequate PO intake Anesthetic complications: no   No notable events documented.   Last Vitals:  Vitals:   07/04/22 1420 07/04/22 1425  BP: 111/75   Pulse: 88 88  Resp: 13 20  Temp:    SpO2: 97% 97%    Last Pain:  Vitals:   07/04/22 1425  TempSrc:   PainSc: (P) 3                  Darrin Nipper

## 2022-07-04 NOTE — Discharge Instructions (Addendum)
Minimally Invasive Cholecystectomy, Care After What can I expect after the procedure? After the procedure, it is common to: Have pain at the areas of surgery. You will be given medicines for pain. Vomit or feel like you may vomit. Feel fullness in the belly (bloating) or have pain in the shoulder. This comes from the gas that was used during the surgery. Follow these instructions at home: Medicines Take over-the-counter and prescription medicines only as told by your doctor. If you were prescribed an antibiotic medicine, take it as told by your doctor. Do not stop taking it even if you start to feel better. If told, take steps to prevent problems with pooping (constipation). You may need to: Drink enough fluid to keep your pee (urine) pale yellow. Take medicines. You will be told what medicines to take. Eat foods that are high in fiber. These include beans, whole grains, and fresh fruits and vegetables. Limit foods that are high in fat and sugar. These include fried or sweet foods. Ask your doctor if you should avoid driving or using machines while you are taking your medicine. Incision care Two incisions closed with skin glue. One is normal. The other is red with pus and infected.   Follow instructions from your doctor about how to take care of your cuts from surgery (incisions). Make sure you: Wash your hands with soap and water for at least 20 seconds before and after you change your bandage (dressing). If you cannot use soap and water, use hand sanitizer. Change your bandage. Leave stitches (sutures) or skin glue in place for at least 2 weeks. Leave tape strips alone unless you are told to take them off. You may trim the edges of the tape strips if they curl up. Do not take baths, swim, or use a hot tub. Ask your doctor about taking showers or sponge baths. Check your incision area every day for signs of infection. Check for: More redness, swelling, or pain. Fluid or  blood. Warmth. Pus or a bad smell. Activity Rest as told by your doctor. Do not do activities that require a lot of effort. Get up to take short walks every 1 to 2 hours. Ask for help if you feel weak or unsteady. Do not lift anything that is heavier than 10 lb (4.5 kg), or the limit that you are told. Do not play contact sports until your doctor says it is okay. Do not return to work or school until your doctor says it is okay. Return to your normal activities when your doctor says that it is safe. General instructions If you were given a sedative during your procedure, do not drive or use machines until your doctor says that it is safe. A sedative is a medicine that helps you relax. Keep all follow-up visits. Contact a doctor if: You get a rash. You have more redness, swelling, or pain around your incisions. You have fluid or blood coming from your incisions. Your incisions feel warm to the touch. You have pus or a bad smell coming from your incisions. You have a fever. One or more of your incisions breaks open. Get help right away if: You have trouble breathing. You have chest pain. You have pain that is getting worse in your shoulders. You faint or feel dizzy when you stand. You have very bad pain in your belly (abdomen). You feel like you may vomit or you vomit, and this lasts for more than one day. You have leg pain. These symptoms may  be an emergency. Get help right away. Call 911. Do not wait to see if the symptoms will go away. Do not drive yourself to the hospital. Summary After your surgery, it is common to have pain at the areas of surgery. You may also vomit or feel fullness in the belly. Follow your doctor's instructions about medicine, activity restrictions, and caring for your surgery areas. Do not do activities that require a lot of effort. Contact a doctor if you have a fever or other signs of infection, such as more redness, swelling, or pain around your  incisions. Get help right away if you have chest pain, increasing pain in the shoulders, or trouble breathing. This information is not intended to replace advice given to you by your health care provider. Make sure you discuss any questions you have with your health care provider. Document Revised: 06/13/2021 Document Reviewed: 06/13/2021 Elsevier Patient Education  [employer].

## 2022-07-04 NOTE — Progress Notes (Signed)
Discharge instructions reviewed with patient at bedside. Patient states she has no further questions or concerns at this time. PIV removed with no complications. Patient [place] home with family

## 2022-07-04 NOTE — TOC Initial Note (Signed)
Transition of Care [facility]) - Initial/Assessment Note    Patient Details  Name: Adrienne Hall MRN: [id number] Date of Birth: 1986/05/09  Transition of Care [facility]) CM/SW Contact:    Conception Oms, RN Phone Number: 07/04/2022, 1:59 PM  Clinical Narrative:                  Transition of Care [facility]) Screening Note   Patient Details  Name: Adrienne Hall Date of Birth: [birth year]   Transition of Care [facility]) CM/SW Contact:    Conception Oms, RN Phone Number: 07/04/2022, 1:59 PM    Transition of Care Department [facility]) has reviewed patient and no TOC needs have been identified at this time. We will continue to monitor patient advancement through interdisciplinary progression rounds. If new patient transition needs arise, please place a TOC consult.          Patient Goals and CMS Choice        Expected Discharge Plan and Services                                                Prior Living Arrangements/Services                       Activities of Daily Living Home Assistive Devices/Equipment: Long-handled sponge, None ADL Screening (condition at time of admission) Patient's cognitive ability adequate to safely complete daily activities?: Yes Is the patient deaf or have difficulty hearing?: No Does the patient have difficulty seeing, even when wearing glasses/contacts?: No Does the patient have difficulty concentrating, remembering, or making decisions?: No Patient able to express need for assistance with ADLs?: Yes Does the patient have difficulty dressing or bathing?: No Independently performs ADLs?: Yes (appropriate for developmental age) Does the patient have difficulty walking or climbing stairs?: No Weakness of Legs: None Weakness of Arms/Hands: None  Permission Sought/Granted                  Emotional Assessment              Admission diagnosis:  Cholecystitis [K81.9] Patient Active Problem List   Diagnosis Date Noted    Cholecystitis 07/03/2022   PTSD (post-traumatic stress disorder) 05/25/2022   Weakness of left leg 04/26/2022   Other fatigue 04/25/2022   Encounter for general adult medical examination with abnormal findings 04/25/2022   Elevated lipids 04/25/2022   Numbness in left leg 04/25/2022   Acute left-sided low back pain with left-sided sciatica 04/25/2022   Adult BMI 29.0-29.9 kg/sq m 04/25/2022   Overweight (BMI 25.0-29.9) 04/25/2022   Adjustment disorder with mixed anxiety and depressed mood 01/24/2022   Atypical mole 04/13/2021   Depression with anxiety 04/01/2019   History of herpes genitalis 11/25/2015   PCP:  Eugenia Pancoast, FNP Pharmacy:   [facility] [address], [place] - [place] [address] Phone: [phone] Fax: [phone]  [id number] [place], [place] [address] [place] [address] Phone: [phone] Fax: [phone]     Social Determinants of Health (SDOH) Interventions    Readmission Risk Interventions     No data to display

## 2022-07-04 NOTE — Op Note (Signed)
Robotic assisted laparoscopic Cholecystectomy  Pre-operative Diagnosis: Acte cholecystitis  Post-operative Diagnosis: same  Procedure:  Robotic assisted laparoscopic Cholecystectomy  Surgeon: Caroleen Hamman, MD FACS  Anesthesia: Gen. with endotracheal tube  Findings: Acute  Cholecystitis   Estimated Blood Loss: 10cc       Specimens: Gallbladder           Complications: none   Procedure Details  The patient was seen again in the Holding Room. The benefits, complications, treatment options, and expected outcomes were discussed with the patient. The risks of bleeding, infection, recurrence of symptoms, failure to resolve symptoms, bile duct damage, bile duct leak, retained common bile duct stone, bowel injury, any of which could require further surgery and/or ERCP, stent, or papillotomy were reviewed with the patient. The likelihood of improving the patient's symptoms with return to their baseline status is good.  The patient and/or family concurred with the proposed plan, giving informed consent.  The patient was taken to Operating Room, identified  and the procedure verified as Laparoscopic Cholecystectomy.  A Time Out was held and the above information confirmed.  Prior to the induction of general anesthesia, antibiotic prophylaxis was administered. VTE prophylaxis was in place. General endotracheal anesthesia was then administered and tolerated well. After the induction, the abdomen was prepped with Chloraprep and draped in the sterile fashion. The patient was positioned in the supine position.  Cut down technique was used to enter the abdominal cavity and a Hasson trochar was placed after two vicryl stitches were anchored to the fascia. Pneumoperitoneum was then created with CO2 and tolerated well without any adverse changes in the patient's vital signs.  Three 8-mm ports were placed under direct vision. All skin incisions  were infiltrated with a local anesthetic agent before making the  incision and placing the trocars.   The patient was positioned  in reverse Trendelenburg, robot was brought to the surgical field and docked in the standard fashion.  We made sure all the instrumentation was kept indirect view at all times and that there were no collision between the arms. I scrubbed out and went to the console.  The gallbladder was identified, the fundus grasped and retracted cephalad. Adhesions were lysed bluntly. The infundibulum was grasped and retracted laterally, exposing the peritoneum overlying the triangle of Calot. This was then divided and exposed in a blunt fashion. An extended critical view of the cystic duct and cystic artery was obtained.  The cystic duct was clearly identified and bluntly dissected.   Artery and duct were double clipped and divided. Using ICG cholangiography we visualize the cystic duct and CBD no evidence of bile injuries observed. The gallbladder was taken from the gallbladder fossa in a retrograde fashion with the electrocautery.  Hemostasis was achieved with the electrocautery. nspection of the right upper quadrant was performed. No bleeding, bile duct injury or leak, or bowel injury was noted. Robotic instruments and robotic arms were undocked in the standard fashion.  I scrubbed back in.  The gallbladder was removed and placed in an Endocatch bag.   Pneumoperitoneum was released.  The periumbilical port site was closed with interrumpted 0 Vicryl sutures. 4-0 subcuticular Monocryl was used to close the skin. Dermabond was  applied.  The patient was then extubated and brought to the recovery room in stable condition. Sponge, lap, and needle counts were correct at closure and at the conclusion of the case.               Caroleen Hamman, MD,  FACS

## 2022-07-05 ENCOUNTER — Ambulatory Visit: Payer: No Typology Code available for payment source | Admitting: Nurse Practitioner

## 2022-07-05 ENCOUNTER — Ambulatory Visit: Payer: No Typology Code available for payment source | Admitting: Dermatology

## 2022-07-05 LAB — SURGICAL PATHOLOGY

## 2022-07-05 NOTE — Discharge Summary (Signed)
  [name] MRN: [id number] DOB/AGE: [birth year] 36 y.o.  Admit date: 07/03/2022 Discharge date: 07/05/2022   Discharge Diagnoses:  Principal Problem:   Cholecystitis   Procedures:robotic cholecystectomy  Hospital Course:  36 yo female admitted with findings consistent with acute cholecystitis, admitted for antibiotics and  was taken promptly to the operating room  the next day for an uneventful laparoscopic cholecystectomy.  AtThe time of discharge the patient was ambulating,  pain was controlled.  Her vital signs were stable and she was afebrile.   physical exam at discharge showed a pt  in no acute distress.  Awake and alert.  Abdomen: Soft incisions healing well without infection or peritonitis.  Extremities well-perfused and no edema.  Condition of the patient the time of discharge was stable  Disposition: Discharge disposition: 01-Home or Self Care       Discharge Instructions     Call MD for:  difficulty breathing, headache or visual disturbances   Complete by: As directed    Call MD for:  extreme fatigue   Complete by: As directed    Call MD for:  hives   Complete by: As directed    Call MD for:  persistant dizziness or light-headedness   Complete by: As directed    Call MD for:  persistant nausea and vomiting   Complete by: As directed    Call MD for:  redness, tenderness, or signs of infection (pain, swelling, redness, odor or green/yellow discharge around incision site)   Complete by: As directed    Call MD for:  severe uncontrolled pain   Complete by: As directed    Call MD for:  temperature >100.4   Complete by: As directed    Diet - low sodium heart healthy   Complete by: As directed    Discharge instructions   Complete by: As directed    Shower 48 hrs   Increase activity slowly   Complete by: As directed    Lifting restrictions   Complete by: As directed    20 lbs x 6 wks      Allergies as of 07/04/2022       Reactions   Penicillins  Hives, Swelling, Rash        Medication List     STOP taking these medications    acetaminophen 500 MG tablet Commonly known as: TYLENOL       TAKE these medications    FLUoxetine 40 MG capsule Commonly known as: PROZAC Take 1 capsule (40 mg total) by mouth daily.   HYDROcodone-acetaminophen 5-325 MG tablet Commonly known as: NORCO/VICODIN Take 1-2 tablets by mouth every 6 (six) hours as needed for moderate pain.   methylPREDNISolone 4 MG Tbpk tablet Commonly known as: MEDROL DOSEPAK Take per package instructions   multivitamin tablet Take 1 tablet by mouth daily.   valACYclovir 500 MG tablet Commonly known as: VALTREX Take 1 tablet (500 mg total) by mouth daily. For herpes prevention.        Follow-up Information     Tylene Fantasia, PA-C Follow up on 07/17/2022.   Specialty: Physician Assistant Contact information: [address] [place] [address] [phone]                  Caroleen Hamman, MD FACS

## 2022-07-17 ENCOUNTER — Ambulatory Visit (INDEPENDENT_AMBULATORY_CARE_PROVIDER_SITE_OTHER): Payer: No Typology Code available for payment source | Admitting: Physician Assistant

## 2022-07-17 ENCOUNTER — Encounter: Payer: Self-pay | Admitting: Physician Assistant

## 2022-07-17 ENCOUNTER — Encounter: Payer: No Typology Code available for payment source | Admitting: Physician Assistant

## 2022-07-17 ENCOUNTER — Ambulatory Visit: Payer: No Typology Code available for payment source | Admitting: Professional

## 2022-07-17 VITALS — BP 129/92 | HR 76 | Temp 98.6°F | Wt 176.6 lb

## 2022-07-17 DIAGNOSIS — K810 Acute cholecystitis: Secondary | ICD-10-CM

## 2022-07-17 DIAGNOSIS — K819 Cholecystitis, unspecified: Secondary | ICD-10-CM

## 2022-07-17 DIAGNOSIS — Z09 Encounter for follow-up examination after completed treatment for conditions other than malignant neoplasm: Secondary | ICD-10-CM

## 2022-07-17 NOTE — Patient Instructions (Signed)

## 2022-07-17 NOTE — Progress Notes (Signed)
[facility] POST-OP OFFICE VISIT  07/17/2022  HPI: Adrienne Hall is a 36 y.o. female 13 days s/p robotic assisted laparoscopic cholecystectomy for acute cholecystitis with Dr Dahlia Byes   She is doing well Soreness at the umbilical incision; 8/32 No using any pain medications No fever, chills, nausea, emesis Stools are looser but denied any diarrhea No issues with incisions   Vital signs: BP (!) 129/92   Pulse 76   Temp 98.6 F (37 C) (Oral)   Wt 176 lb 9.6 oz (80.1 kg)   SpO2 98%   BMI 28.50 kg/m    Physical Exam: Constitutional: Well appearing female, NAD Abdomen: Soft, sore at umbilicus otherwise non-tender, non-distended, no rebound/guarding Skin: Laparoscopic incisions are healing well, no erythema or drainage   Assessment/Plan: This is a 36 y.o. female 13 days s/p robotic assisted laparoscopic cholecystectomy for acute cholecystitis   - Pain control prn  - Reviewed wound care recommendation  - Reviewed lifting restrictions; 4 weeks total  - Reviewed surgical pathology; Acute on chronic cholecystitis; negative for malignancy   - She can follow up on as needed basis; She understands to call with questions/concerns  -- Edison Simon, PA-C [facility] 07/17/2022, 10:57 AM M-F: 7am - 4pm

## 2022-08-01 ENCOUNTER — Ambulatory Visit (INDEPENDENT_AMBULATORY_CARE_PROVIDER_SITE_OTHER): Payer: No Typology Code available for payment source | Admitting: Professional

## 2022-08-01 DIAGNOSIS — F4323 Adjustment disorder with mixed anxiety and depressed mood: Secondary | ICD-10-CM | POA: Diagnosis not present

## 2022-08-01 DIAGNOSIS — F4310 Post-traumatic stress disorder, unspecified: Secondary | ICD-10-CM | POA: Diagnosis not present

## 2022-08-07 ENCOUNTER — Encounter: Payer: Self-pay | Admitting: Professional
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
## 2022-08-15 ENCOUNTER — Ambulatory Visit: Payer: No Typology Code available for payment source | Admitting: Professional

## 2022-08-29 ENCOUNTER — Ambulatory Visit: Payer: No Typology Code available for payment source | Admitting: Professional

## 2022-09-07 ENCOUNTER — Ambulatory Visit: Payer: No Typology Code available for payment source | Admitting: Neurology

## 2022-09-12 ENCOUNTER — Ambulatory Visit: Payer: No Typology Code available for payment source | Admitting: Professional

## 2022-09-26 ENCOUNTER — Ambulatory Visit: Payer: No Typology Code available for payment source | Admitting: Professional

## 2022-10-03 ENCOUNTER — Encounter: Payer: Self-pay | Admitting: Family

## 2022-10-08 NOTE — Telephone Encounter (Signed)
Pt scheduled appt

## 2022-10-10 ENCOUNTER — Ambulatory Visit: Payer: No Typology Code available for payment source | Admitting: Professional

## 2022-10-18 ENCOUNTER — Encounter: Payer: Self-pay | Admitting: Family

## 2022-10-18 ENCOUNTER — Ambulatory Visit (INDEPENDENT_AMBULATORY_CARE_PROVIDER_SITE_OTHER): Payer: No Typology Code available for payment source | Admitting: Family

## 2022-10-18 VITALS — BP 116/72 | HR 81 | Temp 98.6°F | Resp 16 | Ht 66.0 in | Wt 178.4 lb

## 2022-10-18 DIAGNOSIS — Z1283 Encounter for screening for malignant neoplasm of skin: Secondary | ICD-10-CM

## 2022-10-18 DIAGNOSIS — E663 Overweight: Secondary | ICD-10-CM | POA: Diagnosis not present

## 2022-10-18 DIAGNOSIS — R200 Anesthesia of skin: Secondary | ICD-10-CM

## 2022-10-18 DIAGNOSIS — F4323 Adjustment disorder with mixed anxiety and depressed mood: Secondary | ICD-10-CM

## 2022-10-18 DIAGNOSIS — R5383 Other fatigue: Secondary | ICD-10-CM

## 2022-10-18 DIAGNOSIS — D72819 Decreased white blood cell count, unspecified: Secondary | ICD-10-CM | POA: Diagnosis not present

## 2022-10-18 DIAGNOSIS — Z808 Family history of malignant neoplasm of other organs or systems: Secondary | ICD-10-CM

## 2022-10-18 LAB — CBC WITH DIFFERENTIAL/PLATELET
Basophils Absolute: 0.1 10*3/uL (ref 0.0–0.1)
Basophils Relative: 1.2 % (ref 0.0–3.0)
Eosinophils Absolute: 0.3 10*3/uL (ref 0.0–0.7)
Eosinophils Relative: 4.9 % (ref 0.0–5.0)
HCT: 39.9 % (ref 36.0–46.0)
Hemoglobin: 13.2 g/dL (ref 12.0–15.0)
Lymphocytes Relative: 33.2 % (ref 12.0–46.0)
Lymphs Abs: 1.7 10*3/uL (ref 0.7–4.0)
MCHC: 33 g/dL (ref 30.0–36.0)
MCV: 94.5 fl (ref 78.0–100.0)
Monocytes Absolute: 0.5 10*3/uL (ref 0.1–1.0)
Monocytes Relative: 9.1 % (ref 3.0–12.0)
Neutro Abs: 2.7 10*3/uL (ref 1.4–7.7)
Neutrophils Relative %: 51.6 % (ref 43.0–77.0)
Platelets: 276 10*3/uL (ref 150.0–400.0)
RBC: 4.23 Mil/uL (ref 3.87–5.11)
RDW: 13.1 % (ref 11.5–15.5)
WBC: 5.2 10*3/uL (ref 4.0–10.5)

## 2022-10-18 LAB — COMPREHENSIVE METABOLIC PANEL
ALT: 12 U/L (ref 0–35)
AST: 11 U/L (ref 0–37)
Albumin: 3.9 g/dL (ref 3.5–5.2)
Alkaline Phosphatase: 35 U/L — ABNORMAL LOW (ref 39–117)
BUN: 13 mg/dL (ref 6–23)
CO2: 28 mEq/L (ref 19–32)
Calcium: 8.9 mg/dL (ref 8.4–10.5)
Chloride: 104 mEq/L (ref 96–112)
Creatinine, Ser: 1.21 mg/dL — ABNORMAL HIGH (ref 0.40–1.20)
GFR: 57.31 mL/min — ABNORMAL LOW (ref >60.00)
Glucose, Bld: 71 mg/dL (ref 70–99)
Potassium: 4.5 mEq/L (ref 3.5–5.1)
Sodium: 136 mEq/L (ref 135–145)
Total Bilirubin: 0.6 mg/dL (ref 0.2–1.2)
Total Protein: 6.6 g/dL (ref 6.0–8.3)

## 2022-10-18 MED ORDER — BUPROPION HCL ER (XL) 150 MG PO TB24: 150.0000 mg | ORAL_TABLET | Freq: Every day | ORAL | 2 refills | Status: DC

## 2022-10-18 NOTE — Assessment & Plan Note (Signed)
Maintain appt with neurology for consult

## 2022-10-18 NOTE — Assessment & Plan Note (Signed)
Cbc and cmp ordered pending results Advised pt to restart b12 500 mcg once daily

## 2022-10-18 NOTE — Progress Notes (Signed)
Established Patient Office Visit  Subjective:  [name], female    DOB: January 05, 1986  Age: 36 y.o. MRN: [id number]  CC:  Chief Complaint  Patient presents with   Depression    Medication is not working    HPI Adrienne Hall is here today for follow up.   Pt is with acute concerns.  Anxiety and depression: currently on prozac 40 mg once daily. Stopped seeing therapist recently as well, writing in journal daily and working on self help books that were recommended. No suicidal or homicidal ideation. Feels as though she feels 'blah' doesn't feel 'anything' can sit and just go to sleep and not have a care in the world. Not sleeping well, won't sleep at night and or will fall asleep while at work. Decreased libido. Denies seizure history or eating disorder.      10/18/2022   11:02 AM 04/25/2022    8:33 AM 01/05/2022    8:57 AM  PHQ9 SCORE ONLY  PHQ-9 Total Score 13 10 13       04/25/2022    8:32 AM 01/05/2022    9:13 AM  GAD 7 : Generalized Anxiety Score  Nervous, Anxious, on Edge 0 3  Control/stop worrying 0 3  Worry too much - different things 1 3  Trouble relaxing 1 3  Restless 0 0  Easily annoyed or irritable 1 3  Afraid - awful might happen 0 3  Total GAD 7 Score 3 18  Anxiety Difficulty Somewhat difficult Somewhat difficult   Numbness left leg, intermittently, still ongoing. Has appt with neurology 11/3.   Cholecystectomy 9/23, doing well however still with tenderness left lower abdominal side.   Past Medical History:  Diagnosis Date   Family history of breast cancer    Family history of cancer in brother 04/13/2021   Melanoma, soft tissue sarcoma   Family history of melanoma    Family history of multiple myeloma    Herpes simplex    Mental disorder    "Borderline Personality Disorder"   PTSD (post-traumatic stress disorder) 05/25/2022    Past Surgical History:  Procedure Laterality Date   CESAREAN SECTION     WISDOM TOOTH EXTRACTION      Family  History  Problem Relation Age of Onset   Diabetes Maternal Grandmother    Hypertension Maternal Grandmother    Hyperlipidemia Maternal Grandmother    Alzheimer's disease Maternal Grandmother    Stroke Maternal Grandmother 80   Heart disease Maternal Grandfather    Hypertension Maternal Grandfather    Breast cancer Paternal Grandmother 79   Kidney disease Paternal Grandfather    Healthy Mother    Healthy Father    Diabetes Brother        associated with cancer   Melanoma Brother    Other Brother        soft tissue sarcoma; melanoma   Hypertension Brother    Hyperlipidemia Brother    Other Brother        hx brain tumor, was removed   Multiple sclerosis Brother    Multiple myeloma Maternal Aunt    Epilepsy Maternal Aunt    Breast cancer Maternal Great-grandmother 76    Social History   Socioeconomic History   Marital status: Single    Spouse name: Not on file   Number of children: 2   Years of education: college   Highest education level: Not on file  Occupational History   Occupation: Barista: [employer]  Occupation: Merchandiser, 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, works from home  Tobacco Use   Smoking status: Never   Smokeless tobacco: Never  Vaping Use   Vaping Use: Never used  Substance and Sexual Activity   Alcohol use: Yes    Alcohol/week: 1.0 standard drink of alcohol    Types: 1 Glasses of wine per week    Comment: once a week, glass of wine   Drug use: No   Sexual activity: Yes    Partners: Male    Birth control/protection: Implant, Condom  Other Topics Concern   Not on file  Social History Narrative   03/10/20   From: grew up here, most recently moved from [place]: with two daughters   Work: [employer] entry position      Family: [name] (2012) and [name] (2015)      Enjoys: reading and cleaning      Exercise: not currently   Diet: drinks a lot of water, too much caffeine       Safety    Seat belts: Yes    Guns: No   Safe in relationships: Yes    Social Determinants of Radio broadcast assistant Strain: Not on file  Food Insecurity: Not on file  Transportation Needs: Not on file  Physical Activity: Not on file  Stress: Not on file  Social Connections: Not on file  Intimate Partner Violence: Not At Risk (08/18/2020)   Humiliation, Afraid, Rape, and Kick questionnaire    Fear of Current or Ex-Partner: No    Emotionally Abused: No    Physically Abused: No    Sexually Abused: No    Outpatient Medications Prior to Visit  Medication Sig Dispense Refill   FLUoxetine (PROZAC) 40 MG capsule Take 1 capsule (40 mg total) by mouth daily. 90 capsule 3   Multiple Vitamin (MULTIVITAMIN) tablet Take 1 tablet by mouth daily.     valACYclovir (VALTREX) 500 MG tablet Take 1 tablet (500 mg total) by mouth daily. For herpes prevention. 90 tablet 3   No facility-administered medications prior to visit.    Allergies  Allergen Reactions   Penicillins Hives, Swelling and Rash          Objective:    Physical Exam Constitutional:      Appearance: Normal appearance. She is normal weight.  Cardiovascular:     Rate and Rhythm: Normal rate and regular rhythm.  Pulmonary:     Effort: Pulmonary effort is normal.  Musculoskeletal:     Right lower leg: No edema.     Left lower leg: No edema.  Neurological:     General: No focal deficit present.     Mental Status: She is alert and oriented to person, place, and time. Mental status is at baseline.  Psychiatric:        Attention and Perception: Attention and perception normal.        Mood and Affect: Mood normal.        Speech: Speech normal.        Behavior: Behavior normal. Behavior is cooperative.        Thought Content: Thought content normal.        Cognition and Memory: Cognition and memory normal.        Judgment: Judgment normal.     BP 116/72   Pulse 81   Temp 98.6 F (37 C)   Resp 16   Ht 5' 6"  (  1.676 m)    Wt 178 lb 6 oz (80.9 kg)   SpO2 98%   BMI 28.79 kg/m  Wt Readings from Last 3 Encounters:  10/18/22 178 lb 6 oz (80.9 kg)  07/17/22 176 lb 9.6 oz (80.1 kg)  07/04/22 174 lb (78.9 kg)     Health Maintenance Due  Topic Date Due   COVID-19 Vaccine (1) Never done    There are no preventive care reminders to display for this patient.  Lab Results  Component Value Date   TSH 1.38 04/25/2022   Lab Results  Component Value Date   WBC 5.3 07/03/2022   HGB 12.8 07/03/2022   HCT 39.3 07/03/2022   MCV 94.9 07/03/2022   PLT 268 07/03/2022   Lab Results  Component Value Date   NA 137 07/03/2022   K 4.0 07/03/2022   CO2 25 07/03/2022   GLUCOSE 129 (H) 07/03/2022   BUN 13 07/03/2022   CREATININE 0.92 07/03/2022   BILITOT 0.5 07/03/2022   ALKPHOS 41 07/03/2022   AST 13 (L) 07/03/2022   ALT 15 07/03/2022   PROT 6.8 07/03/2022   ALBUMIN 3.8 07/03/2022   CALCIUM 9.2 07/03/2022   ANIONGAP 7 07/03/2022   GFR 79.90 04/25/2022   Lab Results  Component Value Date   CHOL 207 (H) 04/25/2022   Lab Results  Component Value Date   HDL 63.90 04/25/2022   Lab Results  Component Value Date   LDLCALC 130 (H) 04/25/2022   Lab Results  Component Value Date   TRIG 66.0 04/25/2022   Lab Results  Component Value Date   CHOLHDL 3 04/25/2022   No results found for: "HGBA1C"    Assessment & Plan:   Problem List Items Addressed This Visit       Other   Adjustment disorder with mixed anxiety and depressed mood - Primary    Start Wellbutrin 150 mg XL and continue Prozac  F/u three weeks Consider another psychological consult for therapy       Relevant Medications   buPROPion (WELLBUTRIN XL) 150 MG 24 hr tablet   Other fatigue    Cbc and cmp ordered pending results Advised pt to restart b12 500 mcg once daily      Relevant Orders   Comprehensive metabolic panel   CBC with Differential   Numbness in left leg    Maintain appt with neurology for consult      Overweight  (BMI 25.0-29.9)   Relevant Orders   Comprehensive metabolic panel   CBC with Differential   Other Visit Diagnoses     Leukopenia, unspecified type       Relevant Orders   CBC with Differential       Meds ordered this encounter  Medications   buPROPion (WELLBUTRIN XL) 150 MG 24 hr tablet    Sig: Take 1 tablet (150 mg total) by mouth daily.    Dispense:  30 tablet    Refill:  2    Order Specific Question:   Supervising Provider    Answer:   Diona Browner, AMY E [[id number]]    Follow-up: Return in about 1 month (around 11/18/2022) for f/u depression.    Eugenia Pancoast, FNP

## 2022-10-18 NOTE — Assessment & Plan Note (Signed)
Start Wellbutrin 150 mg XL and continue Prozac  F/u three weeks Consider another psychological consult for therapy

## 2022-10-18 NOTE — Patient Instructions (Addendum)
Start wellbutrin 150 mg once daily XL  Continue prozac 40 mg once daily    A referral was placed today for dermatology for annual skin screening.  Please let us know if you have not heard back within 2 weeks about the referral.   ------------------------------------    Managing Anxiety, Adult After being diagnosed with anxiety, you may be relieved to know why you have felt or behaved a certain way. You may also feel overwhelmed about the treatment ahead and what it will mean for your life. With care and support, you can manage this condition. How to manage lifestyle changes Managing stress and anxiety  Stress is your body's reaction to life changes and events, both good and bad. Most stress will last just a few hours, but stress can be ongoing and can lead to more than just stress. Although stress can play a major role in anxiety, it is not the same as anxiety. Stress is usually caused by something external, such as a deadline, test, or competition. Stress normally passes after the triggering event has ended.  Anxiety is caused by something internal, such as imagining a terrible outcome or worrying that something will go wrong that will devastate you. Anxiety often does not go away even after the triggering event is over, and it can become long-term (chronic) worry. It is important to understand the differences between stress and anxiety and to manage your stress effectively so that it does not lead to an anxious response. Talk with your health care provider or a counselor to learn more about reducing anxiety and stress. He or she may suggest tension reduction techniques, such as: Music therapy. Spend time creating or listening to music that you enjoy and that inspires you. Mindfulness-based meditation. Practice being aware of your normal breaths while not trying to control your breathing. It can be done while sitting or walking. Centering prayer. This involves focusing on a word, phrase, or  sacred image that means something to you and brings you peace. Deep breathing. To do this, expand your stomach and inhale slowly through your nose. Hold your breath for 3-5 seconds. Then exhale slowly, letting your stomach muscles relax. Self-talk. Learn to notice and identify thought patterns that lead to anxiety reactions and change those patterns to thoughts that feel peaceful. Muscle relaxation. Taking time to tense muscles and then relax them. Choose a tension reduction technique that fits your lifestyle and personality. These techniques take time and practice. Set aside 5-15 minutes a day to do them. Therapists can offer counseling and training in these techniques. The training to help with anxiety may be covered by some insurance plans. Other things you can do to manage stress and anxiety include: Keeping a stress diary. This can help you learn what triggers your reaction and then learn ways to manage your response. Thinking about how you react to certain situations. You may not be able to control everything, but you can control your response. Making time for activities that help you relax and not feeling guilty about spending your time in this way. Doing visual imagery. This involves imagining or creating mental pictures to help you relax. Practicing yoga. Through yoga poses, you can lower tension and promote relaxation.  Medicines Medicines can help ease symptoms. Medicines for anxiety include: Antidepressant medicines. These are usually prescribed for long-term daily control. Anti-anxiety medicines. These may be added in severe cases, especially when panic attacks occur. Medicines will be prescribed by a health care provider. When used together, medicines,  psychotherapy, and tension reduction techniques may be the most effective treatment. Relationships Relationships can play a big part in helping you recover. Try to spend more time connecting with trusted friends and family  members. Consider going to couples counseling if you have a partner, taking family education classes, or going to family therapy. Therapy can help you and others better understand your condition. How to recognize changes in your anxiety Everyone responds differently to treatment for anxiety. Recovery from anxiety happens when symptoms decrease and stop interfering with your daily activities at home or work. This may mean that you will start to: Have better concentration and focus. Worry will interfere less in your daily thinking. Sleep better. Be less irritable. Have more energy. Have improved memory. It is also important to recognize when your condition is getting worse. Contact your health care provider if your symptoms interfere with home or work and you feel like your condition is not improving. Follow these instructions at home: Activity Exercise. Adults should do the following: Exercise for at least 150 minutes each week. The exercise should increase your heart rate and make you sweat (moderate-intensity exercise). Strengthening exercises at least twice a week. Get the right amount and quality of sleep. Most adults need 7-9 hours of sleep each night. Lifestyle  Eat a healthy diet that includes plenty of vegetables, fruits, whole grains, low-fat dairy products, and lean protein. Do not eat a lot of foods that are high in fats, added sugars, or salt (sodium). Make choices that simplify your life. Do not use any products that contain nicotine or tobacco. These products include cigarettes, chewing tobacco, and vaping devices, such as e-cigarettes. If you need help quitting, ask your health care provider. Avoid caffeine, alcohol, and certain over-the-counter cold medicines. These may make you feel worse. Ask your pharmacist which medicines to avoid. General instructions Take over-the-counter and prescription medicines only as told by your health care provider. Keep all follow-up visits.  This is important. Where to find support You can get help and support from these sources: Self-help groups. Online and [employer]. A trusted spiritual leader. Couples counseling. Family education classes. Family therapy. Where to find more information You may find that joining a support group helps you deal with your anxiety. The following sources can help you locate counselors or support groups near you: [place]: www.mentalhealthamerica.net Anxiety and Depression Association of [place] (ADAA): https://www.clark.net/ National Alliance on Mental Illness (NAMI): www.nami.org Contact a health care provider if: You have a hard time staying focused or finishing daily tasks. You spend many hours a day feeling worried about everyday life. You become exhausted by worry. You start to have headaches or frequently feel tense. You develop chronic nausea or diarrhea. Get help right away if: You have a racing heart and shortness of breath. You have thoughts of hurting yourself or others. If you ever feel like you may hurt yourself or others, or have thoughts about taking your own life, get help right away. Go to your nearest emergency department or: Call your local emergency services (911 in the [place] Call a suicide crisis helpline, such as the [place] at 6237350200 or 988 in the [place]. This is open 24 hours a day in the [place] Text the Crisis Text Line at 539-359-5275 (in the [place].). Summary Taking steps to learn and use tension reduction techniques can help calm you and help prevent triggering an anxiety reaction. When used together, medicines, psychotherapy, and tension reduction techniques may be the  most effective treatment. Family, friends, and partners can play a big part in supporting you. This information is not intended to replace advice given to you by your health care provider. Make sure you discuss any questions you have with your health care  provider. Document Revised: 07/05/2021 Document Reviewed: 04/02/2021 Elsevier Patient Education  [place].

## 2022-10-24 ENCOUNTER — Ambulatory Visit: Payer: No Typology Code available for payment source | Admitting: Professional

## 2022-10-25 NOTE — Progress Notes (Signed)
[facility] Note - Initial Visit   Date: 10/26/2022   Adrienne Hall MRN: [id number] DOB: Apr 05, 1986   Dear Adrienne Pancoast, FNP:   Thank you for your kind referral of Adrienne Hall for consultation of left leg numbness. Although her history is well known to you, please allow [place] to reiterate it for the purpose of our medical record. The patient was accompanied to the clinic by self.    Adrienne Hall is a 36 y.o. right-handed female with depression/anxiety presenting for evaluation of left leg numbness.   IMPRESSION/PLAN: Episodic left leg numbness, ?sciatica.  Neurological exam is normal without evidence of radiculopathy or CNS disorder.  To further evaluation, she will return for NCS/EMG of the left leg. Start PT for left leg stretching and strengthening.   Further recommendations pending results.  ------------------------------------------------------------- History of present illness: For the past 2-3 years, she episodic numbness of the left leg which lasts a few minutes.  Tingling and sharp pain starts around the left buttocks and then she has numbness involving the entire leg and foot.  When it occurs, her left leg buckles because she is unable to feel it.   It can also occur at rest.  There is no associated pain or weakness.  No numbness/tingling of the left arm or face. It occurs about 2-3 times per month.  No identifiable triggers.   She recalls having an IM antibiotic administered to left buttocks and felt that the needle jerked during injection and since this time developed left buttocks and leg pain.    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   Out-side paper records, electronic medical record, and images have been reviewed where available and summarized as:  No results found for: "HGBA1C" Lab Results  Component Value Date   VITAMINB12 430 04/25/2022   Lab Results  Component Value Date    TSH 1.38 04/25/2022    Past Medical History:  Diagnosis Date   Family history of breast cancer    Family history of cancer in brother 04/13/2021   Melanoma, soft tissue sarcoma   Family history of melanoma    Family history of multiple myeloma    Herpes simplex    Mental disorder    "Borderline Personality Disorder"   PTSD (post-traumatic stress disorder) 05/25/2022    Past Surgical History:  Procedure Laterality Date   CESAREAN SECTION     GALLBLADDER SURGERY     WISDOM TOOTH EXTRACTION       Medications:  Outpatient Encounter Medications as of 10/26/2022  Medication Sig   APPLE CIDER VINEGAR PO Take by mouth.   buPROPion (WELLBUTRIN XL) 150 MG 24 hr tablet Take 1 tablet (150 mg total) by mouth daily.   FLUoxetine (PROZAC) 40 MG capsule Take 1 capsule (40 mg total) by mouth daily.   Multiple Vitamin (MULTIVITAMIN) tablet Take 1 tablet by mouth daily.   valACYclovir (VALTREX) 500 MG tablet Take 1 tablet (500 mg total) by mouth daily. For herpes prevention.   No facility-administered encounter medications on file as of 10/26/2022.    Allergies:  Allergies  Allergen Reactions   Penicillins Hives, Swelling and Rash    Family History: Family History  Problem Relation Age of Onset   Diabetes Maternal Grandmother    Hypertension Maternal Grandmother    Hyperlipidemia Maternal Grandmother    Alzheimer's disease Maternal Grandmother    Stroke Maternal Grandmother 66  Heart disease Maternal Grandfather    Hypertension Maternal Grandfather    Breast cancer Paternal Grandmother 33   Kidney disease Paternal Grandfather    Healthy Mother    Healthy Father    Diabetes Brother        associated with cancer   Melanoma Brother    Other Brother        soft tissue sarcoma; melanoma   Hypertension Brother    Hyperlipidemia Brother    Other Brother        hx brain tumor, was removed   Multiple sclerosis Brother    Multiple myeloma Maternal Aunt    Epilepsy Maternal Aunt     Breast cancer Maternal Great-grandmother 90    Social History: Social History   Tobacco Use   Smoking status: Never   Smokeless tobacco: Never  Vaping Use   Vaping Use: Some days  Substance Use Topics   Alcohol use: Yes    Alcohol/week: 1.0 standard drink of alcohol    Types: 1 Glasses of wine per week    Comment: once a week, glass of wine   Drug use: No   Social History   Social History Narrative   03/10/20   From: grew up here, most recently moved from [place]: with two daughters   Work: [employer] entry position      Family: [name] (2012) and [name] (2015)      Enjoys: reading and cleaning      Exercise: not currently   Diet: drinks a lot of water, too much caffeine       Safety   Seat belts: Yes    Guns: No   Safe in relationships: Yes       Right handed        Vital Signs:  BP 125/85   Pulse 80   Ht _0  (1.676 m)   Wt 180 lb 9.6 oz (81.9 kg)   SpO2 98%   BMI 29.15 kg/m    Neurological Exam: MENTAL STATUS including orientation to time, place, person, recent and remote memory, attention span and concentration, language, and fund of knowledge is normal.  Speech is not dysarthric.  CRANIAL NERVES: II:  No visual field defects.     III-IV-VI: Pupils equal round and reactive to light.  Normal conjugate, extra-ocular eye movements in all directions of gaze.  No nystagmus.  No ptosis.   V:  Normal facial sensation.    VII:  Normal facial symmetry and movements.   VIII:  Normal hearing and vestibular function.   IX-X:  Normal palatal movement.   XI:  Normal shoulder shrug and head rotation.   XII:  Normal tongue strength and range of motion, no deviation or fasciculation.  MOTOR:  No atrophy, fasciculations or abnormal movements.  No pronator drift.   Upper Extremity:  Right  Left  Deltoid  5/5   5/5   Biceps  5/5   5/5   Triceps  5/5   5/5   Wrist extensors  5/5   5/5   Wrist flexors  5/5   5/5   Finger extensors  5/5    5/5   Finger flexors  5/5   5/5   Dorsal interossei  5/5   5/5   Abductor pollicis  5/5   5/5   Tone (Ashworth scale)  0  0   Lower Extremity:  Right  Left  Hip flexors  5/5   5/5  Hip extensors  5/5   5/5   Knee flexors  5/5   5/5   Knee extensors  5/5   5/5   Dorsiflexors  5/5   5/5   Plantarflexors  5/5   5/5   Toe extensors  5/5   5/5   Toe flexors  5/5   5/5   Tone (Ashworth scale)  0  0   MSRs:                                           Right        Left brachioradialis 2+  2+  biceps 2+  2+  triceps 2+  2+  patellar 2+  2+  ankle jerk 2+  2+  Hoffman no  no   SENSORY:  Normal and symmetric perception of light touch, pinprick, vibration, and proprioception.  Romberg's sign absent.   COORDINATION/GAIT: Normal finger-to- nose-finger.  Intact rapid alternating movements bilaterally.  Gait narrow based and stable. Tandem and stressed gait intact.     Thank you for allowing me to participate in patient's care.  If I can answer any additional questions, I would be pleased to do so.    Sincerely,    Treylan Mcclintock K. Posey Pronto, DO

## 2022-10-26 ENCOUNTER — Ambulatory Visit (INDEPENDENT_AMBULATORY_CARE_PROVIDER_SITE_OTHER): Payer: No Typology Code available for payment source | Admitting: Neurology

## 2022-10-26 ENCOUNTER — Encounter: Payer: Self-pay | Admitting: Neurology

## 2022-10-26 VITALS — BP 125/85 | HR 80 | Ht 66.0 in | Wt 180.6 lb

## 2022-10-26 DIAGNOSIS — M5432 Sciatica, left side: Secondary | ICD-10-CM | POA: Diagnosis not present

## 2022-10-26 DIAGNOSIS — R200 Anesthesia of skin: Secondary | ICD-10-CM | POA: Diagnosis not present

## 2022-10-26 DIAGNOSIS — R202 Paresthesia of skin: Secondary | ICD-10-CM

## 2022-10-26 NOTE — Patient Instructions (Signed)
Nerve testing of the left leg  Referral to physical therapy for left leg stretching and strengthening  ELECTROMYOGRAM AND NERVE CONDUCTION STUDIES (EMG/NCS) INSTRUCTIONS  How to Prepare The neurologist conducting the EMG will need to know if you have certain medical conditions. Tell the neurologist and other EMG lab personnel if you: Have a pacemaker or any other electrical medical device Take blood-thinning medications Have hemophilia, a blood-clotting disorder that causes prolonged bleeding Bathing Take a shower or bath shortly before your exam in order to remove oils from your skin. Don't apply lotions or creams before the exam.  What to Expect You'll likely be asked to change into a hospital gown for the procedure and lie down on an examination table. The following explanations can help you understand what will happen during the exam.  Electrodes. The neurologist or a technician places surface electrodes at various locations on your skin depending on where you're experiencing symptoms. Or the neurologist may insert needle electrodes at different sites depending on your symptoms.  Sensations. The electrodes will at times transmit a tiny electrical current that you may feel as a twinge or spasm. The needle electrode may cause discomfort or pain that usually ends shortly after the needle is removed. If you are concerned about discomfort or pain, you may want to talk to the neurologist about taking a short break during the exam.  Instructions. During the needle EMG, the neurologist will assess whether there is any spontaneous electrical activity when the muscle is at rest - activity that isn't present in healthy muscle tissue - and the degree of activity when you slightly contract the muscle.  He or she will give you instructions on resting and contracting a muscle at appropriate times. Depending on what muscles and nerves the neurologist is examining, he or she may ask you to change positions  during the exam.  After your EMG You may experience some temporary, minor bruising where the needle electrode was inserted into your muscle. This bruising should fade within several days. If it persists, contact your primary care doctor.

## 2022-11-09 ENCOUNTER — Other Ambulatory Visit: Payer: Self-pay | Admitting: Family

## 2022-11-09 DIAGNOSIS — F4323 Adjustment disorder with mixed anxiety and depressed mood: Secondary | ICD-10-CM

## 2022-11-29 ENCOUNTER — Ambulatory Visit: Payer: No Typology Code available for payment source | Admitting: Neurology

## 2022-11-29 ENCOUNTER — Telehealth: Payer: Self-pay

## 2022-11-29 DIAGNOSIS — R200 Anesthesia of skin: Secondary | ICD-10-CM | POA: Diagnosis not present

## 2022-11-29 DIAGNOSIS — R202 Paresthesia of skin: Secondary | ICD-10-CM | POA: Diagnosis not present

## 2022-11-29 DIAGNOSIS — M5432 Sciatica, left side: Secondary | ICD-10-CM

## 2022-11-29 NOTE — Procedures (Signed)
  [facility] Neurology  [place], [place]  [place], [place] [address] Tel: [phone] Fax: [phone] Test Date:  11/29/2022  Patient: Adrienne Hall DOB: 03/31/1986 Physician: Narda Amber, DO  Sex: Female Height: '5\' 6"'$  Ref Phys: Narda Amber, DO  ID#: [id number]   Technician:    History: This is a 36 year old female referred for evaluation of intermittent left leg paresthesias and pain.  NCV & EMG Findings: Extensive electrodiagnostic testing of the left lower extremity shows:  Left sural and superficial peroneal sensory responses are within normal limits. Left peroneal and tibial motor responses are within normal limits. Left tibial H reflex study is within normal limits. There is no evidence of active or chronic motor axonal loss changes affecting any of the tested muscles.  Motor unit configuration and recruitment pattern is within normal limits.  Impression: This is a normal study of the left lower extremity.  In particular, there is no evidence of a sensorimotor polyneuropathy or lumbosacral radiculopathy.   ___________________________ Narda Amber, DO    Nerve Conduction Studies   Stim Site NR Peak (ms) Norm Peak (ms) O-P Amp (V) Norm O-P Amp  Left Sup Peroneal Anti Sensory (Ant Lat Mall)  33 C  12 cm    2.1 <4.5 13.9 >5  Left Sural Anti Sensory (Lat Mall)  33 C  Calf    2.9 <4.5 32.4 >5     Stim Site NR Onset (ms) Norm Onset (ms) O-P Amp (mV) Norm O-P Amp Site1 Site2 Delta-0 (ms) Dist (cm) Vel (m/s) Norm Vel (m/s)  Left Peroneal Motor (Ext Dig Brev)  33 C  Ankle    3.2 <5.5 4.1 >3 B Fib Ankle 7.6 38.0 50 >40  B Fib    10.8  3.8  Poplt B Fib 1.5 8.0 53 >40  Poplt    12.3  3.8         Left Tibial Motor (Abd Hall Brev)  33 C  Ankle    3.5 <6.0 9.5 >8 Knee Ankle 9.0 41.0 46 >40  Knee    12.5  8.1          Electromyography   Side Muscle Ins.Act Fibs Fasc Recrt Amp Dur Poly Activation Comment  Left AntTibialis Nml Nml Nml Nml Nml Nml Nml Nml N/A   Left Gastroc Nml Nml Nml Nml Nml Nml Nml Nml N/A  Left Flex Dig Long Nml Nml Nml Nml Nml Nml Nml Nml N/A  Left BicepsFemS Nml Nml Nml Nml Nml Nml Nml Nml N/A  Left RectFemoris Nml Nml Nml Nml Nml Nml Nml Nml N/A  Left GluteusMed Nml Nml Nml Nml Nml Nml Nml Nml N/A      Waveforms:

## 2022-11-29 NOTE — Telephone Encounter (Signed)
Called [facility] and they stated that patient no showed her first initial appointment and had called and left a message for the patient to contact them to rescheduled. The receptionist stated she will contact the PT location and have them reach back out to the patient. Number provided for patient to call [facility] as well: [phone].

## 2022-12-05 ENCOUNTER — Telehealth: Payer: Self-pay

## 2022-12-05 NOTE — Telephone Encounter (Signed)
Patient returned call about results, asked if she doesn't answer to send a message through [facility] as she can better communicate that way.

## 2022-12-07 NOTE — Telephone Encounter (Signed)
Patient was informed of lab results. See results.

## 2023-01-17 ENCOUNTER — Other Ambulatory Visit: Payer: Self-pay | Admitting: Family

## 2023-01-17 DIAGNOSIS — F321 Major depressive disorder, single episode, moderate: Secondary | ICD-10-CM

## 2023-01-30 ENCOUNTER — Encounter: Payer: Self-pay | Admitting: Family

## 2023-01-31 NOTE — Telephone Encounter (Signed)
Spoke to pt, scheduled ov for 02/06/23

## 2023-01-31 NOTE — Telephone Encounter (Signed)
Pt was supposed to be seen back in November. Needs appt for 90 day supply.

## 2023-02-06 ENCOUNTER — Ambulatory Visit (INDEPENDENT_AMBULATORY_CARE_PROVIDER_SITE_OTHER): Payer: No Typology Code available for payment source | Admitting: Family

## 2023-02-06 ENCOUNTER — Encounter: Payer: Self-pay | Admitting: Family

## 2023-02-06 VITALS — BP 120/84 | HR 77 | Temp 97.7°F | Ht 66.0 in | Wt 177.8 lb

## 2023-02-06 DIAGNOSIS — E663 Overweight: Secondary | ICD-10-CM | POA: Insufficient documentation

## 2023-02-06 DIAGNOSIS — F321 Major depressive disorder, single episode, moderate: Secondary | ICD-10-CM

## 2023-02-06 DIAGNOSIS — Z6828 Body mass index (BMI) 28.0-28.9, adult: Secondary | ICD-10-CM

## 2023-02-06 DIAGNOSIS — Z8619 Personal history of other infectious and parasitic diseases: Secondary | ICD-10-CM

## 2023-02-06 DIAGNOSIS — R200 Anesthesia of skin: Secondary | ICD-10-CM

## 2023-02-06 DIAGNOSIS — R944 Abnormal results of kidney function studies: Secondary | ICD-10-CM | POA: Diagnosis not present

## 2023-02-06 DIAGNOSIS — F4323 Adjustment disorder with mixed anxiety and depressed mood: Secondary | ICD-10-CM

## 2023-02-06 DIAGNOSIS — F418 Other specified anxiety disorders: Secondary | ICD-10-CM

## 2023-02-06 LAB — COMPREHENSIVE METABOLIC PANEL
ALT: 14 U/L (ref 0–35)
AST: 11 U/L (ref 0–37)
Albumin: 4 g/dL (ref 3.5–5.2)
Alkaline Phosphatase: 38 U/L — ABNORMAL LOW (ref 39–117)
BUN: 11 mg/dL (ref 6–23)
CO2: 28 mEq/L (ref 19–32)
Calcium: 9.4 mg/dL (ref 8.4–10.5)
Chloride: 105 mEq/L (ref 96–112)
Creatinine, Ser: 0.9 mg/dL (ref 0.40–1.20)
GFR: 81.58 mL/min (ref >60.00)
Glucose, Bld: 70 mg/dL (ref 70–99)
Potassium: 4.2 mEq/L (ref 3.5–5.1)
Sodium: 137 mEq/L (ref 135–145)
Total Bilirubin: 0.5 mg/dL (ref 0.2–1.2)
Total Protein: 7.1 g/dL (ref 6.0–8.3)

## 2023-02-06 MED ORDER — VALACYCLOVIR HCL 500 MG PO TABS: 500.0000 mg | ORAL_TABLET | Freq: Every day | ORAL | 3 refills | Status: DC

## 2023-02-06 MED ORDER — BUPROPION HCL ER (XL) 300 MG PO TB24: 300.0000 mg | ORAL_TABLET | Freq: Every day | ORAL | 1 refills | Status: AC

## 2023-02-06 MED ORDER — FLUOXETINE HCL 40 MG PO CAPS: 40.0000 mg | ORAL_CAPSULE | Freq: Every day | ORAL | 3 refills | Status: DC

## 2023-02-06 NOTE — Assessment & Plan Note (Signed)
Increase water intake  Repeat GFR pending results

## 2023-02-06 NOTE — Progress Notes (Signed)
Established Patient Office Visit  Subjective:      CC:  Chief Complaint  Patient presents with   Medical Management of Chronic Issues    HPI: Adrienne Hall is a 37 y.o. female presenting on 02/06/2023 for Medical Management of Chronic Issues . Anxiety/depression: stopped prozac in October and started wellbutrin 150 mg XL and prozac continued. Pt has not since considered consult with therapy, states doing ok with Journaling and some self help medications. Slight improvement with libido as well.   She doesn't have issues with falling asleep as much anymore, does feel fatigued from time to time but has greatly improved.   Numbness left leg: did an EMG was unremarkable, suggestion was for PT however was in [place] which doesn't work for her so she did not proceed with this.        Social history:  Relevant past medical, surgical, family and social history reviewed and updated as indicated. Interim medical history since our last visit reviewed.  Allergies and medications reviewed and updated.  DATA REVIEWED: CHART IN EPIC     ROS: Negative unless specifically indicated above in HPI.    Current Outpatient Medications:    buPROPion (WELLBUTRIN XL) 300 MG 24 hr tablet, Take 1 tablet (300 mg total) by mouth daily., Disp: 90 tablet, Rfl: 1   Multiple Vitamin (MULTIVITAMIN) tablet, Take 1 tablet by mouth daily., Disp: , Rfl:    FLUoxetine (PROZAC) 40 MG capsule, Take 1 capsule (40 mg total) by mouth daily., Disp: 90 capsule, Rfl: 3   valACYclovir (VALTREX) 500 MG tablet, Take 1 tablet (500 mg total) by mouth daily. For herpes prevention., Disp: 90 tablet, Rfl: 3      Objective:    BP 120/84   Pulse 77   Temp 97.7 F (36.5 C) (Temporal)   Ht 5' 6"$  (1.676 m)   Wt 177 lb 12.8 oz (80.6 kg)   SpO2 98%   BMI 28.70 kg/m   Wt Readings from Last 3 Encounters:  02/06/23 177 lb 12.8 oz (80.6 kg)  10/26/22 180 lb 9.6 oz (81.9 kg)  10/18/22 178 lb 6 oz (80.9 kg)    Physical  Exam  Physical Exam Constitutional:      General: not in acute distress.    Appearance: Normal appearance. normal weight. is not ill-appearing, toxic-appearing or diaphoretic.  Cardiovascular:     Rate and Rhythm: Normal rate.  Pulmonary:     Effort: Pulmonary effort is normal.  Musculoskeletal:        General: Normal range of motion.  Neurological:     General: No focal deficit present.     Mental Status: alert and oriented to person, place, and time. Mental status is at baseline.  Psychiatric:        Mood and Affect: Mood normal.        Behavior: Behavior normal.        Thought Content: Thought content normal.        Judgment: Judgment normal.        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  Dispense: 90 tablet; Refill: 1 -     FLUoxetine HCl; Take 1 capsule (40 mg total) by mouth daily.  Dispense:  90 capsule; Refill: 3  History of herpes genitalis -     valACYclovir HCl; Take 1 tablet (500 mg total) by mouth daily. For herpes prevention.  Dispense: 90 tablet; Refill: 3  Overweight with body mass index (BMI) of 28 to 28.9 in adult  Adjustment disorder with mixed anxiety and depressed mood Assessment & Plan: Continue prozac 40 mg  Increase wellbutrin to 300 mg XL  F/u via mychart in four weeks  Pt declines therapy at this time   Numbness in left leg Assessment & Plan: Evaluated by neurology, stable. Pt to consider PT if able in the future.      Return in about 6 months (around 08/07/2023) for f/u anxiety.  Eugenia Pancoast, MSN, APRN, FNP-C [place]

## 2023-02-06 NOTE — Assessment & Plan Note (Signed)
Evaluated by neurology, stable. Pt to consider PT if able in the future.

## 2023-02-06 NOTE — Patient Instructions (Signed)
Stop by the lab prior to leaving today. I will notify you of your results once received.  Increase wellbutrin to 300 mg XL once daily.    Regards,   Maurita Havener FNP-C   ------------------------------------ Managing Anxiety, Adult After being diagnosed with anxiety, you may be relieved to know why you have felt or behaved a certain way. You may also feel overwhelmed about the treatment ahead and what it will mean for your life. With care and support, you can manage this condition. How to manage lifestyle changes Managing stress and anxiety  Stress is your body's reaction to life changes and events, both good and bad. Most stress will last just a few hours, but stress can be ongoing and can lead to more than just stress. Although stress can play a major role in anxiety, it is not the same as anxiety. Stress is usually caused by something external, such as a deadline, test, or competition. Stress normally passes after the triggering event has ended.  Anxiety is caused by something internal, such as imagining a terrible outcome or worrying that something will go wrong that will devastate you. Anxiety often does not go away even after the triggering event is over, and it can become long-term (chronic) worry. It is important to understand the differences between stress and anxiety and to manage your stress effectively so that it does not lead to an anxious response. Talk with your health care provider or a counselor to learn more about reducing anxiety and stress. He or she may suggest tension reduction techniques, such as: Music therapy. Spend time creating or listening to music that you enjoy and that inspires you. Mindfulness-based meditation. Practice being aware of your normal breaths while not trying to control your breathing. It can be done while sitting or walking. Centering prayer. This involves focusing on a word, phrase, or sacred image that means something to you and brings you  peace. Deep breathing. To do this, expand your stomach and inhale slowly through your nose. Hold your breath for 3-5 seconds. Then exhale slowly, letting your stomach muscles relax. Self-talk. Learn to notice and identify thought patterns that lead to anxiety reactions and change those patterns to thoughts that feel peaceful. Muscle relaxation. Taking time to tense muscles and then relax them. Choose a tension reduction technique that fits your lifestyle and personality. These techniques take time and practice. Set aside 5-15 minutes a day to do them. Therapists can offer counseling and training in these techniques. The training to help with anxiety may be covered by some insurance plans. Other things you can do to manage stress and anxiety include: Keeping a stress diary. This can help you learn what triggers your reaction and then learn ways to manage your response. Thinking about how you react to certain situations. You may not be able to control everything, but you can control your response. Making time for activities that help you relax and not feeling guilty about spending your time in this way. Doing visual imagery. This involves imagining or creating mental pictures to help you relax. Practicing yoga. Through yoga poses, you can lower tension and promote relaxation.  Medicines Medicines can help ease symptoms. Medicines for anxiety include: Antidepressant medicines. These are usually prescribed for long-term daily control. Anti-anxiety medicines. These may be added in severe cases, especially when panic attacks occur. Medicines will be prescribed by a health care provider. When used together, medicines, psychotherapy, and tension reduction techniques may be the most effective treatment. Relationships Relationships  can play a big part in helping you recover. Try to spend more time connecting with trusted friends and family members. Consider going to couples counseling if you have a partner,  taking family education classes, or going to family therapy. Therapy can help you and others better understand your condition. How to recognize changes in your anxiety Everyone responds differently to treatment for anxiety. Recovery from anxiety happens when symptoms decrease and stop interfering with your daily activities at home or work. This may mean that you will start to: Have better concentration and focus. Worry will interfere less in your daily thinking. Sleep better. Be less irritable. Have more energy. Have improved memory. It is also important to recognize when your condition is getting worse. Contact your health care provider if your symptoms interfere with home or work and you feel like your condition is not improving. Follow these instructions at home: Activity Exercise. Adults should do the following: Exercise for at least 150 minutes each week. The exercise should increase your heart rate and make you sweat (moderate-intensity exercise). Strengthening exercises at least twice a week. Get the right amount and quality of sleep. Most adults need 7-9 hours of sleep each night. Lifestyle  Eat a healthy diet that includes plenty of vegetables, fruits, whole grains, low-fat dairy products, and lean protein. Do not eat a lot of foods that are high in fats, added sugars, or salt (sodium). Make choices that simplify your life. Do not use any products that contain nicotine or tobacco. These products include cigarettes, chewing tobacco, and vaping devices, such as e-cigarettes. If you need help quitting, ask your health care provider. Avoid caffeine, alcohol, and certain over-the-counter cold medicines. These may make you feel worse. Ask your pharmacist which medicines to avoid. General instructions Take over-the-counter and prescription medicines only as told by your health care provider. Keep all follow-up visits. This is important. Where to find support You can get help and support  from these sources: Self-help groups. Online and [employer]. A trusted spiritual leader. Couples counseling. Family education classes. Family therapy. Where to find more information You may find that joining a support group helps you deal with your anxiety. The following sources can help you locate counselors or support groups near you: [place]: www.mentalhealthamer[place].net Anxiety and Depression Association of [place] (ADAA): https://www.[place]ark.net/ National Alliance on Mental Illness (NAMI): www.nami.org Contact a health care provider if: You have a hard time staying focused or finishing daily tasks. You spend many hours a day feeling worried about everyday life. You become exhausted by worry. You start to have headaches or frequently feel tense. You develop chronic nausea or diarrhea. Get help right away if: You have a racing heart and shortness of breath. You have thoughts of hurting yourself or others. If you ever feel like you may hurt yourself or others, or have thoughts about taking your own life, get help right away. Go to your nearest emergency department or: Call your local emergency services (911 in the [place] Call a suicide crisis helpline, such as the [place] at 626 404 6997 or 988 in the [place]. This is open 24 hours a day in the [place] Text the Crisis Text Line at 959-828-3020 (in the [place].). Summary Taking steps to learn and use tension reduction techniques can help calm you and help prevent triggering an anxiety reaction. When used together, medicines, psychotherapy, and tension reduction techniques may be the most effective treatment. Family, friends, and partners can play a big part in  supporting you. This information is not intended to replace advice given to you by your health care provider. Make sure you discuss any questions you have with your health care provider. Document Revised: 07/05/2021 Document Reviewed:  04/02/2021 Elsevier Patient Education  [place].

## 2023-02-06 NOTE — Assessment & Plan Note (Signed)
Continue prozac 40 mg  Increase wellbutrin to 300 mg XL  F/u via mychart in four weeks  Pt declines therapy at this time

## 2023-03-04 ENCOUNTER — Encounter: Payer: Self-pay | Admitting: Family

## 2023-05-24 ENCOUNTER — Other Ambulatory Visit: Payer: Self-pay | Admitting: Family

## 2023-05-24 DIAGNOSIS — F4323 Adjustment disorder with mixed anxiety and depressed mood: Secondary | ICD-10-CM

## 2023-06-04 ENCOUNTER — Encounter: Payer: Self-pay | Admitting: Physician Assistant

## 2023-06-04 ENCOUNTER — Encounter: Payer: Self-pay | Admitting: Family

## 2023-06-05 NOTE — Telephone Encounter (Signed)
Do you remove or should she go to gyn?

## 2023-07-18 ENCOUNTER — Encounter: Payer: Self-pay | Admitting: Family Medicine

## 2023-07-18 ENCOUNTER — Ambulatory Visit: Payer: Medicaid Other | Admitting: Family Medicine

## 2023-07-18 VITALS — BP 106/68 | HR 86 | Ht 66.0 in | Wt 183.0 lb

## 2023-07-18 DIAGNOSIS — Z3046 Encounter for surveillance of implantable subdermal contraceptive: Secondary | ICD-10-CM

## 2023-07-18 DIAGNOSIS — Z3009 Encounter for other general counseling and advice on contraception: Secondary | ICD-10-CM | POA: Diagnosis not present

## 2023-07-18 DIAGNOSIS — Z0000 Encounter for general adult medical examination without abnormal findings: Secondary | ICD-10-CM

## 2023-07-18 NOTE — Progress Notes (Signed)
[facility] [address] Main Number: [phone]  Family Planning Visit- Repeat Yearly Visit  Subjective:  Adrienne Hall is a 37 y.o. G2P2002  being seen today for an annual wellness visit and to discuss contraception options.   The patient is currently using Hormonal Implant for pregnancy prevention. Patient does not want a pregnancy in the next year.    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  Chief Complaint  Patient presents with   Annual Exam    PE and Nexplanon removal    Patient reports to clinic for PE and Nexplanon removal   Patient denies concerns about self.    See flowsheet for other program required questions.   Body mass index is 29.54 kg/m. - Patient is eligible for diabetes screening based on BMI> 25 and age >35?  yes HA1C ordered? Declined- states her PCP checks this  Patient reports 1 of partners in last year. Desires STI screening?  No - declined   Has patient been screened once for HCV in the past?  No  No results found for: "HCVAB"  Does the patient have current of drug use, have a partner with drug use, and/or has been incarcerated since last result? No  If yes-- Screen for HCV through [facility]   Does the patient meet criteria for HBV testing? No  Criteria:  -Household, sexual or needle sharing contact with HBV -History of drug use -HIV positive -Those with known Hep C   Health Maintenance Due  Topic Date Due   COVID-19 Vaccine (1 -  2023-24 season) Never done    Review of Systems  Constitutional:  Negative for weight loss.  Eyes:  Negative for blurred vision.  Respiratory:  Negative for cough and shortness of breath.   Cardiovascular:  Negative for claudication.  Gastrointestinal:  Positive for nausea.  Genitourinary:  Negative for dysuria and frequency.  Skin:  Negative for rash.  Neurological:  Positive for headaches.  Endo/Heme/Allergies:  Does not bruise/bleed easily.    The following portions of the patient's history were reviewed and updated as appropriate: allergies, current medications, past family history, past medical history, past social history, past surgical history and problem list. Problem list updated.  Objective:   Vitals:   07/18/23 1327  BP: 106/68  Pulse: 86  Weight: 183 lb (83 kg)  Height: 5\' 6"  (1.676 m)    Physical Exam Constitutional:      Appearance: Normal appearance.  HENT:     Head: Normocephalic and atraumatic.  Pulmonary:     Effort: Pulmonary effort is normal.  Abdominal:     Palpations: Abdomen is soft.  Musculoskeletal:        General: Normal range of motion.  Skin:    General: Skin is warm and dry.  Neurological:     General: No focal deficit present.     Mental Status: She is alert.  Psychiatric:        Mood and Affect: Mood normal.        Behavior: Behavior normal.  Declined CBE today    Assessment and Plan:  Adrienne Hall is a 37 y.o. female G2P2002 presenting to the [facility] for an yearly wellness and contraception visit  1. Well woman exam (no gynecological exam) -declined CBE- reports recently having it done at PCP office -Pap UTD- next due in Jan 2025 -reports nausea that has been going on for "years" -reports 30# weight gain in the last year -reports headaches that just started- states she gets about 2/week, uses Tylenol and water for relief   2. Encounter for Nexplanon removal Nexplanon Removal Patient identified,  informed consent performed, consent signed.   Appropriate time out taken. Nexplanon site identified.  Area prepped in usual sterile fashon. 3 ml of 1% lidocaine with Epinephrine was used to anesthetize the area at the distal end of the implant and along implant site. A small stab incision was made right beside the implant on the distal portion.  The Nexplanon rod was grasped using hemostats and removed without difficulty.  There was minimal blood loss. There were no complications.  Steri-strips were applied over the small incision.  A pressure bandage was applied to reduce any bruising.  The patient tolerated the procedure well and was given post procedure instructions.     Nexplanon:   Counseled patient to take OTC analgesic starting as soon as lidocaine starts to wear off and take regularly for at least 48 hr to decrease discomfort.  Specifically to take with food or milk to decrease stomach upset and for IB 600 mg (3 tablets) every 6 hrs; IB 800 mg (4 tablets) every 8 hrs; or Aleve 2 tablets every 12 hrs.    3. Family planning Contraception counseling: Reviewed options based on patient desire and reproductive life plan. Patient is interested in Female Condom. This was provided to the patient today.   Risks, benefits, and typical effectiveness rates were reviewed.  Questions were answered.  Written information was also given to the patient to review.    The patient will follow up in  1 years for surveillance.  The patient was told to call with any further questions, or with any concerns about this method of contraception.  Emphasized use of condoms 100% of the time for STI prevention.  Educated on ECP and assessed need for ECP. Not indicated- covered under Nexplanon    Return in about 1 year (around 07/17/2024) for annual well-woman exam.  Future Appointments  Date Time Provider Department Center  08/07/2023 11:00 AM Mort Sawyers, FNP [facility]  09/11/2023  9:45 AM Deirdre Evener, MD  [facility] None    Lenice Llamas, FNP

## 2023-07-18 NOTE — Progress Notes (Signed)
Pt is here for PE and Nexplanon removal.  Pt declines STD testing.  FP packet given.  Berdie Ogren, RN

## 2023-07-24 ENCOUNTER — Encounter (INDEPENDENT_AMBULATORY_CARE_PROVIDER_SITE_OTHER): Payer: Self-pay

## 2023-08-02 IMAGING — DX DG LUMBAR SPINE COMPLETE 4+V
5 series · 5 of 5 positions shown · non-contrast
Comparison: None Available.

CLINICAL DATA: Left lower back pain with sciatica and leg numbness
on and off for 3 years.

EXAM:
LUMBAR SPINE - COMPLETE 4+ VIEW

[lumbar spine ap]
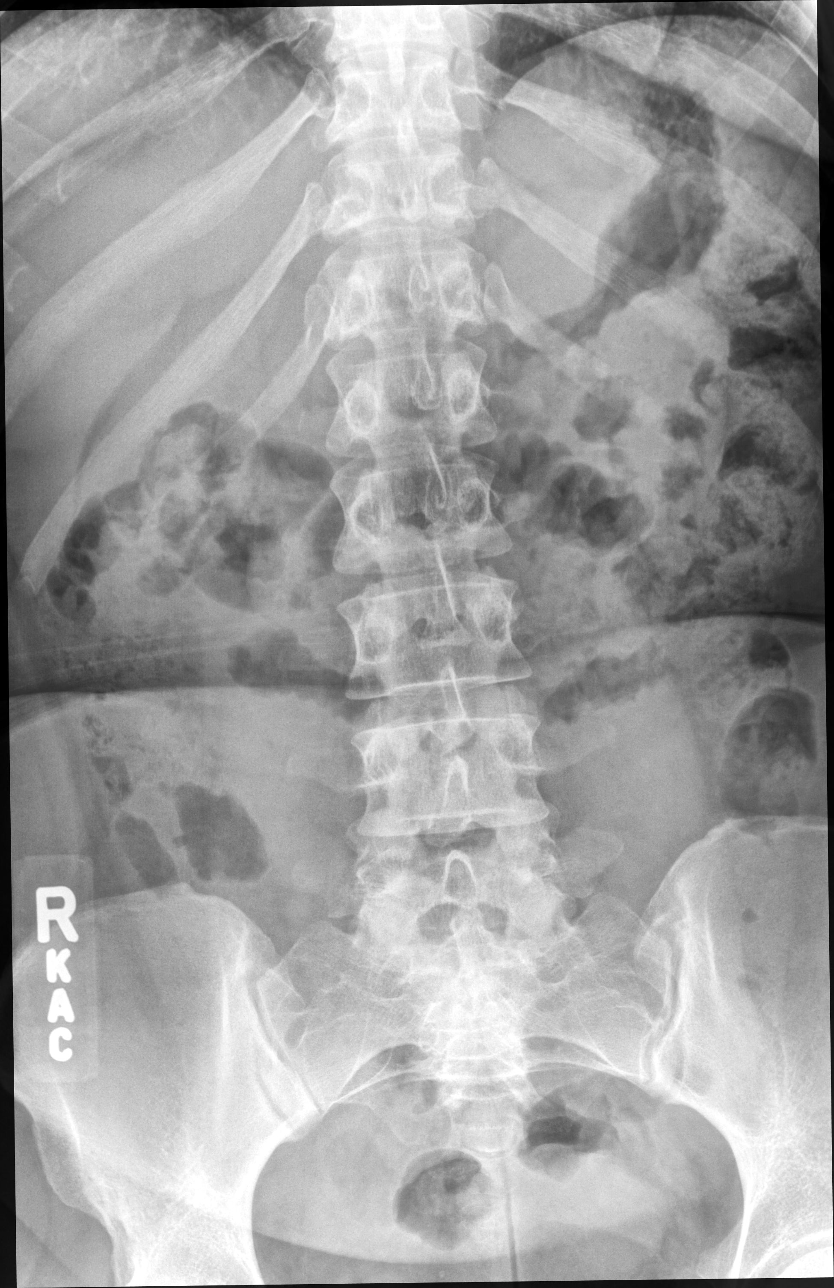

[lumbar spine mlo (1 of 2)]
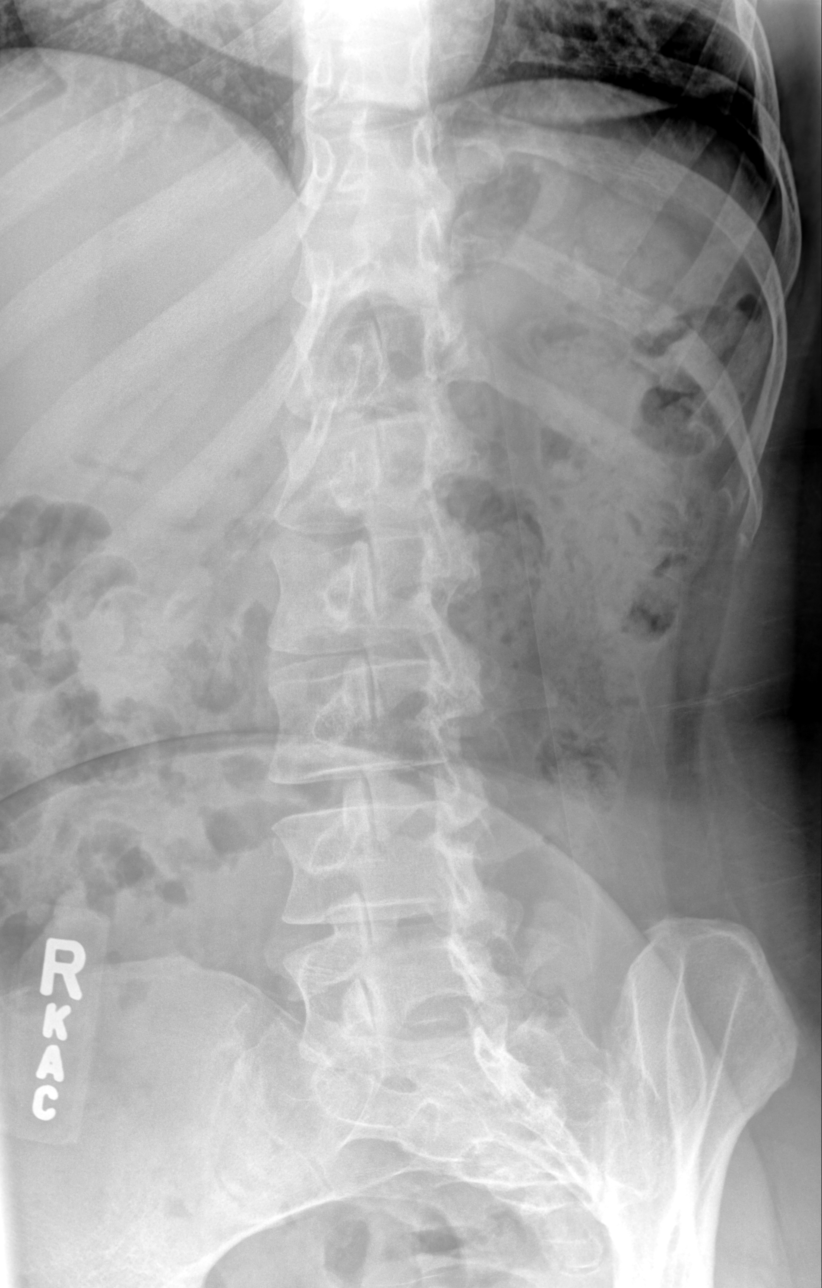

[lumbar spine mlo (2 of 2)]
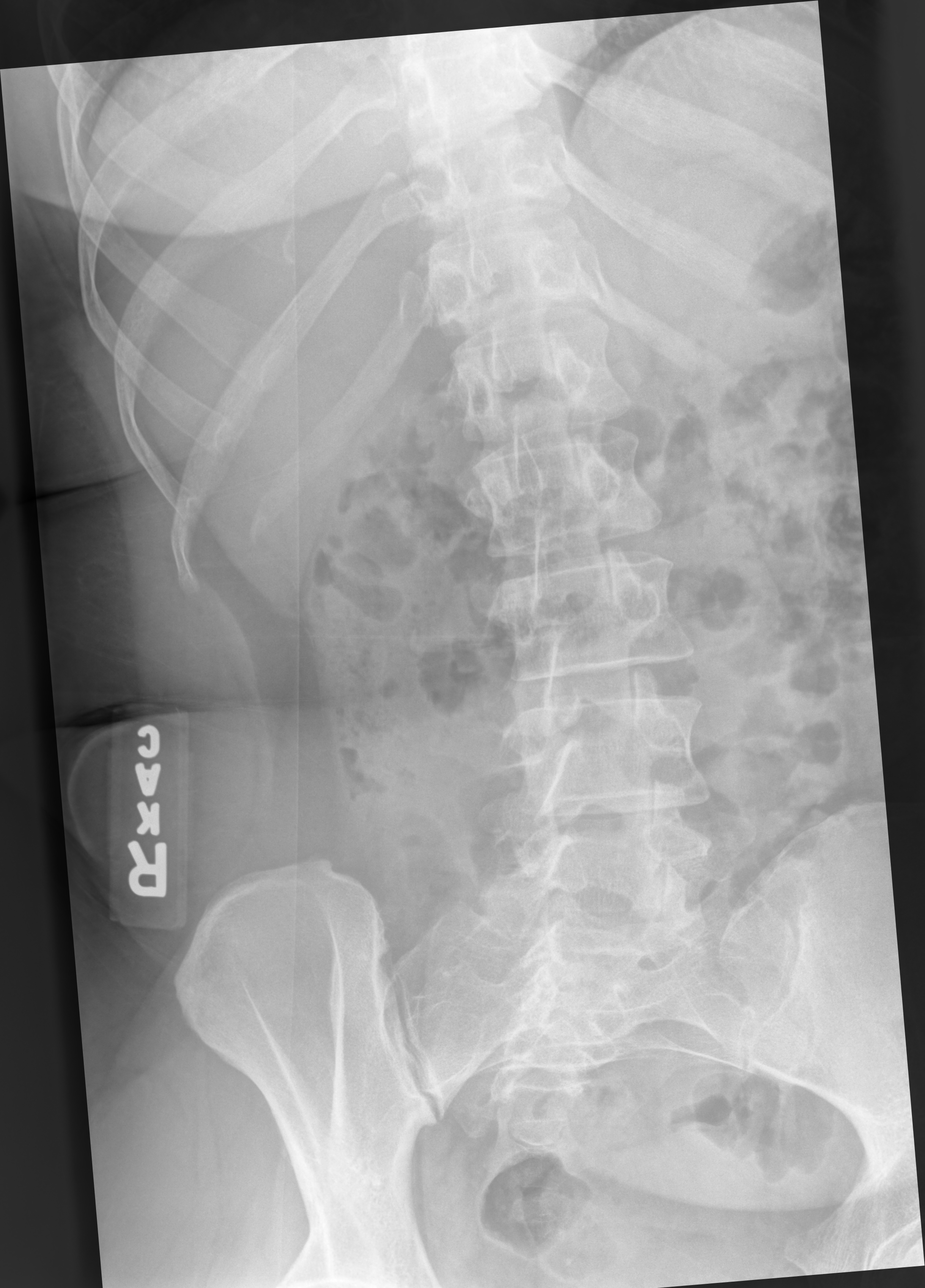

[lumbar spine lat (1 of 2)]
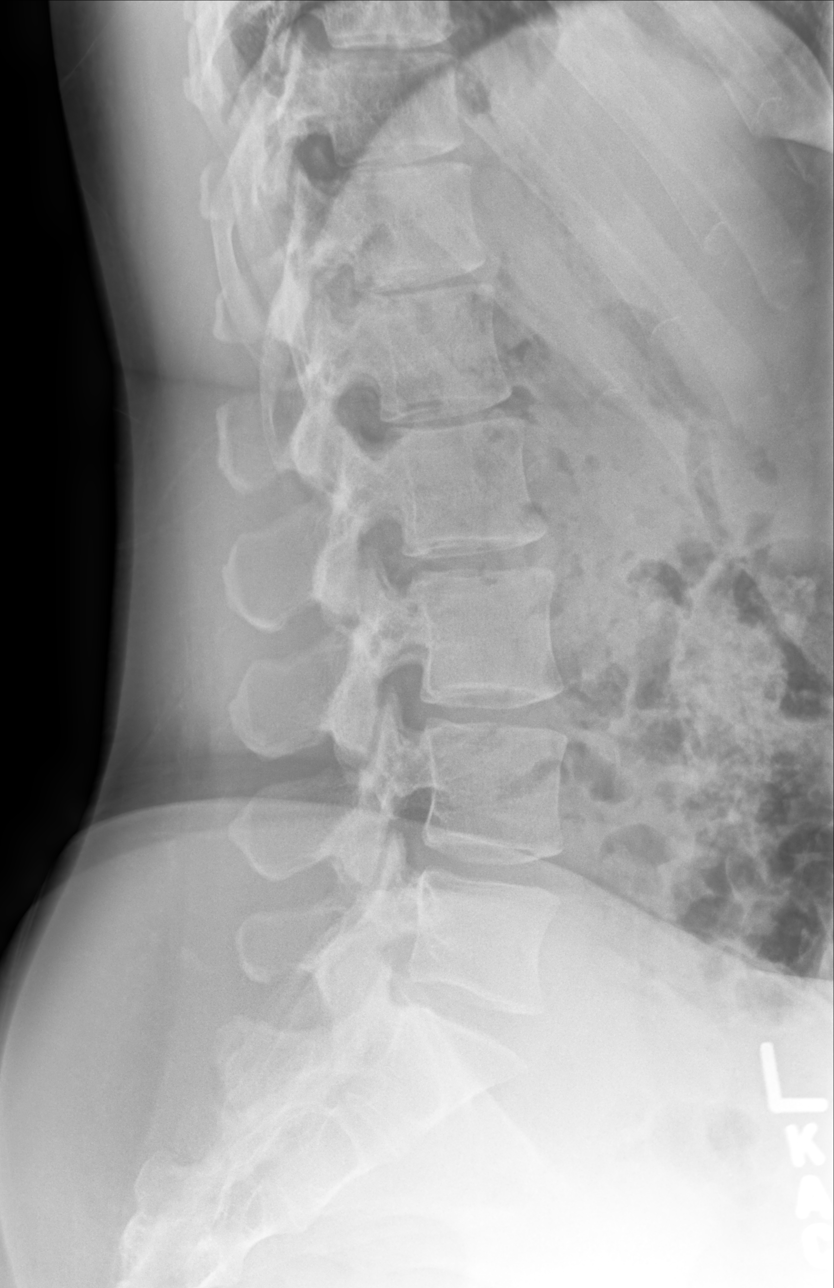

[lumbar spine lat (2 of 2)]
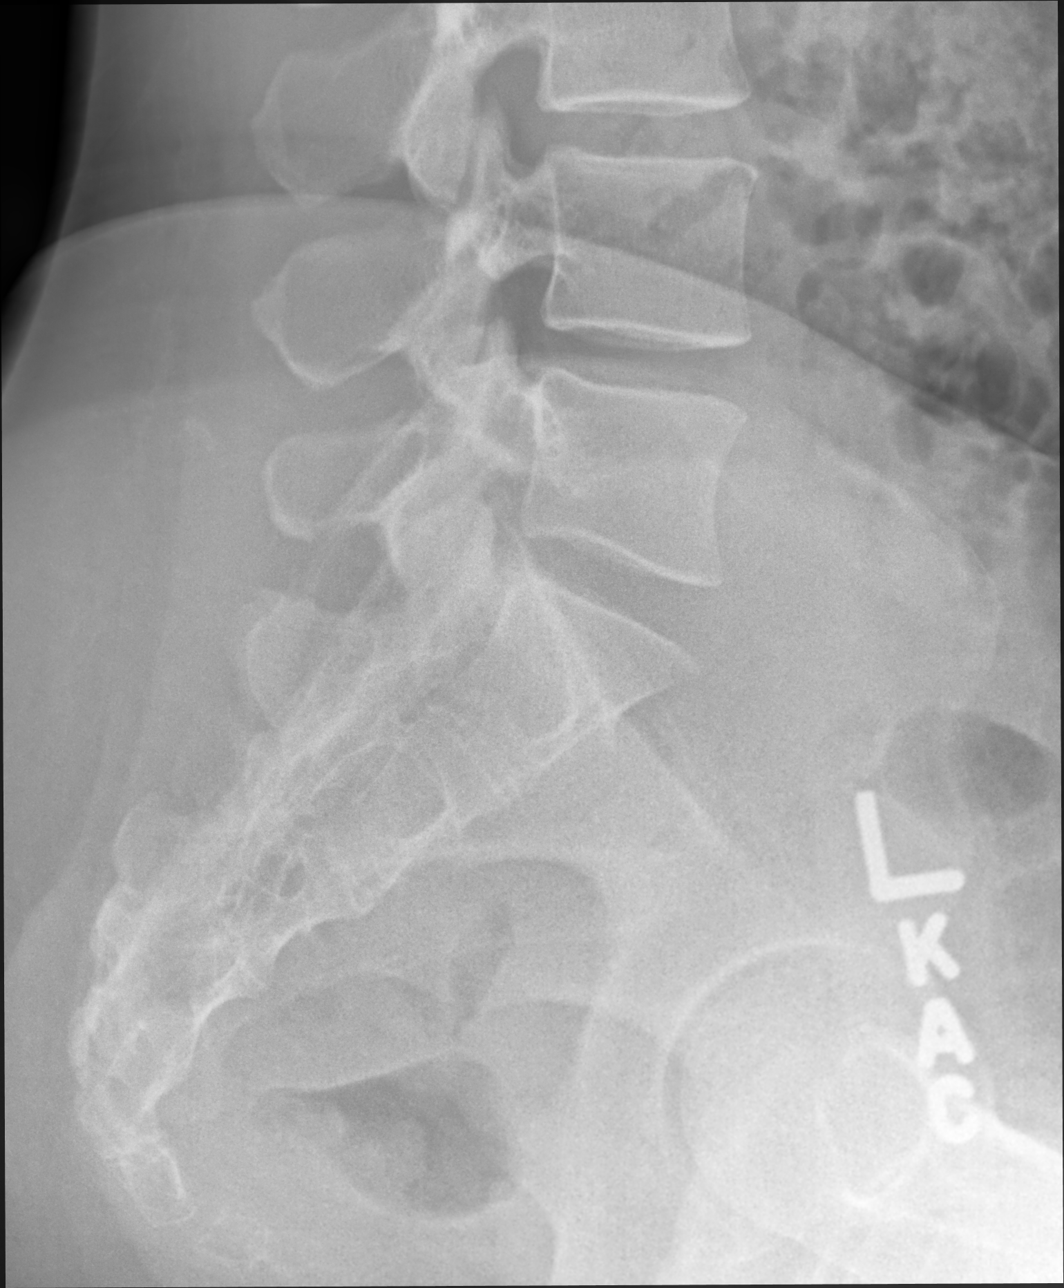

[5 of 5 positions shown; findings below may reference images not displayed]

FINDINGS: There is no evidence of lumbar spine fracture. Alignment is normal.
Intervertebral disc spaces are maintained.
IMPRESSION: Negative.

## 2023-08-07 ENCOUNTER — Ambulatory Visit: Payer: Medicaid Other | Admitting: Family

## 2023-08-08 ENCOUNTER — Encounter (INDEPENDENT_AMBULATORY_CARE_PROVIDER_SITE_OTHER): Payer: Self-pay

## 2023-09-11 ENCOUNTER — Encounter: Payer: Self-pay | Admitting: Dermatology

## 2023-09-11 ENCOUNTER — Encounter: Payer: Self-pay | Admitting: Family

## 2023-09-11 ENCOUNTER — Ambulatory Visit (INDEPENDENT_AMBULATORY_CARE_PROVIDER_SITE_OTHER): Payer: Managed Care, Other (non HMO) | Admitting: Family

## 2023-09-11 ENCOUNTER — Ambulatory Visit (INDEPENDENT_AMBULATORY_CARE_PROVIDER_SITE_OTHER): Payer: Managed Care, Other (non HMO) | Admitting: Dermatology

## 2023-09-11 VITALS — Temp 97.3°F | Ht 66.0 in | Wt 184.0 lb

## 2023-09-11 DIAGNOSIS — Z8619 Personal history of other infectious and parasitic diseases: Secondary | ICD-10-CM

## 2023-09-11 DIAGNOSIS — Z6829 Body mass index (BMI) 29.0-29.9, adult: Secondary | ICD-10-CM | POA: Insufficient documentation

## 2023-09-11 DIAGNOSIS — D229 Melanocytic nevi, unspecified: Secondary | ICD-10-CM | POA: Diagnosis not present

## 2023-09-11 DIAGNOSIS — L814 Other melanin hyperpigmentation: Secondary | ICD-10-CM | POA: Diagnosis not present

## 2023-09-11 DIAGNOSIS — Z3009 Encounter for other general counseling and advice on contraception: Secondary | ICD-10-CM

## 2023-09-11 DIAGNOSIS — L821 Other seborrheic keratosis: Secondary | ICD-10-CM | POA: Diagnosis not present

## 2023-09-11 DIAGNOSIS — Z1283 Encounter for screening for malignant neoplasm of skin: Secondary | ICD-10-CM | POA: Diagnosis not present

## 2023-09-11 DIAGNOSIS — F418 Other specified anxiety disorders: Secondary | ICD-10-CM

## 2023-09-11 DIAGNOSIS — F321 Major depressive disorder, single episode, moderate: Secondary | ICD-10-CM | POA: Diagnosis not present

## 2023-09-11 DIAGNOSIS — Z808 Family history of malignant neoplasm of other organs or systems: Secondary | ICD-10-CM

## 2023-09-11 MED ORDER — VALACYCLOVIR HCL 1 G PO TABS: ORAL_TABLET | ORAL | Status: AC

## 2023-09-11 NOTE — Progress Notes (Signed)
Established Patient Office Visit  Subjective:      CC:  Chief Complaint  Patient presents with   Anxiety    HPI: Adrienne Hall is a 37 y.o. female presenting on 09/11/2023 for Anxiety . Anxiety depression: still taking prozac 40 mg once daily and increased Wellbutrin in feb 2024 to 300 mg XL. Still doing well with this increase and feeling much better place than she was with prior. She does not see a therapist as it was conflicting with her work time. She works as a Dietitian and hard to make time during the day for therapy appt as lunch recently decreased to 30 min. She currently works 8 am to 5 pm, and or from 10 am to 7 pm.   She might need FMLA in the future to honor her appointments with therapy as she really feels this would be beneficial for her.   New complaints: She wanted to let me know that she no longer has nexplanon.  She has considered trying for a baby , she is not doing anything to prevent it.  Not currently taking pre natal vitamin.   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 She has on hand over for as needed.   Due for pap smear, last one was in 2020. Negative.    Social history:  Relevant past medical, surgical, family and social history reviewed and updated as indicated. Interim medical history since our last visit reviewed.  Allergies and medications reviewed and updated.  DATA REVIEWED: CHART IN EPIC     ROS: Negative unless specifically indicated above in HPI.    Current Outpatient Medications:    buPROPion (WELLBUTRIN XL) 300 MG 24 hr tablet, Take 1 tablet (300 mg total) by mouth daily., Disp: 90 tablet, Rfl: 1   FLUoxetine (PROZAC) 40 MG capsule, Take 1 capsule (40 mg total) by mouth daily., Disp: 90 capsule, Rfl: 3   Multiple Vitamin (MULTIVITAMIN) tablet, Take 1 tablet by mouth daily., Disp: , Rfl:    valACYclovir (VALTREX) 1000 MG tablet, Take one po bid x 5 days prn outbreak, Disp: ,  Rfl:       Objective:    Temp (!) 97.3 F (36.3 C) (Temporal)   Ht 5\' 6"  (1.676 m)   Wt 184 lb (83.5 kg)   SpO2 99%   BMI 29.70 kg/m   Wt Readings from Last 3 Encounters:  09/11/23 184 lb (83.5 kg)  07/18/23 183 lb (83 kg)  02/06/23 177 lb 12.8 oz (80.6 kg)    Physical Exam Constitutional:      General: She is not in acute distress.    Appearance: Normal appearance. She is normal weight. She is not ill-appearing, toxic-appearing or diaphoretic.  HENT:     Head: Normocephalic.  Cardiovascular:     Rate and Rhythm: Normal rate.  Pulmonary:     Effort: Pulmonary effort is normal.  Musculoskeletal:        General: Normal range of motion.  Neurological:     General: No focal deficit present.     Mental Status: She is alert and oriented to person, place, and time. Mental status is at baseline.  Psychiatric:        Mood and Affect: Mood normal.        Behavior: Behavior normal.        Thought Content: Thought content normal.        Judgment: Judgment normal.  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  Discussed therapy, can consider FMLA for appointments if needed  Pt verbalized understanding and will let me know if this is something she decides to pursue.   History  of herpes genitalis Assessment & Plan: Only prn , stopped taking daily.  No recent episodes only had initial outbreak then no more.  Valtrex prn  Orders: -     valACYclovir HCl; Take one po bid x 5 days prn outbreak  Adult BMI 29.0-29.9 kg/sq m  Atypical mole Assessment & Plan: Had appt this am with dermatology, no concerning findings per pt      Return in about 1 year (around 09/10/2024) for f/u PAP, f/u CPE.  Mort Sawyers, MSN, APRN, FNP-C [facility]

## 2023-09-11 NOTE — Assessment & Plan Note (Signed)
Discussed with patient to start prenatal if family planning

## 2023-09-11 NOTE — Assessment & Plan Note (Signed)
Had appt this am with dermatology, no concerning findings per pt

## 2023-09-11 NOTE — Patient Instructions (Signed)
Recommend daily broad spectrum sunscreen SPF 30+ to sun-exposed areas, reapply every 2 hours as needed. Call for new or changing lesions.  Staying in the shade or wearing long sleeves, sun glasses (UVA+UVB protection) and wide brim hats (4-inch brim around the entire circumference of the hat) are also recommended for sun protection.      Melanoma ABCDEs  Melanoma is the most dangerous type of skin cancer, and is the leading cause of death from skin disease.  You are more likely to develop melanoma if you: Have light-colored skin, light-colored eyes, or red or blond hair Spend a lot of time in the sun Tan regularly, either outdoors or in a tanning bed Have had blistering sunburns, especially during childhood Have a close family member who has had a melanoma Have atypical moles or large birthmarks  Early detection of melanoma is key since treatment is typically straightforward and cure rates are extremely high if we catch it early.   The first sign of melanoma is often a change in a mole or a new dark spot.  The ABCDE system is a way of remembering the signs of melanoma.  A for asymmetry:  The two halves do not match. B for border:  The edges of the growth are irregular. C for color:  A mixture of colors are present instead of an even brown color. D for diameter:  Melanomas are usually (but not always) greater than 6mm - the size of a pencil eraser. E for evolution:  The spot keeps changing in size, shape, and color.  Please check your skin once per month between visits. You can use a small mirror in front and a large mirror behind you to keep an eye on the back side or your body.   If you see any new or changing lesions before your next follow-up, please call to schedule a visit.  Please continue daily skin protection including broad spectrum sunscreen SPF 30+ to sun-exposed areas, reapplying every 2 hours as needed when you're outdoors.   Staying in the shade or wearing long sleeves,  sun glasses (UVA+UVB protection) and wide brim hats (4-inch brim around the entire circumference of the hat) are also recommended for sun protection.    Due to recent changes in healthcare laws, you may see results of your pathology and/or laboratory studies on MyChart before the doctors have had a chance to review them. We understand that in some cases there may be results that are confusing or concerning to you. Please understand that not all results are received at the same time and often the doctors may need to interpret multiple results in order to provide you with the best plan of care or course of treatment. Therefore, we ask that you please give [place] 2 business days to thoroughly review all your results before contacting the office for clarification. Should we see a critical lab result, you will be contacted sooner.   If You Need Anything After Your Visit  If you have any questions or concerns for your doctor, please call our main line at [phone] and press option 4 to reach your doctor's medical assistant. If no one answers, please leave a voicemail as directed and we will return your call as soon as possible. Messages left after 4 pm will be answered the following business day.   You may also send [place] a message via MyChart. We typically respond to MyChart messages within 1-2 business days.  For prescription refills, please ask your pharmacy to  contact our office. Our fax number is [phone].  If you have an urgent issue when the clinic is closed that cannot wait until the next business day, you can page your doctor at the number below.    Please note that while we do our best to be available for urgent issues outside of office hours, we are not available 24/7.   If you have an urgent issue and are unable to reach [place], you may choose to seek medical care at your doctor's office, retail clinic, urgent care center, or emergency room.  If you have a medical emergency, please immediately  call 911 or go to the emergency department.  Pager Numbers  - Dr. Gwen Pounds: [phone]  - Dr. Roseanne Reno: [phone]  - Dr. Katrinka Blazing: [phone]   In the event of inclement weather, please call our main line at [phone] for an update on the status of any delays or closures.  Dermatology Medication Tips: Please keep the boxes that topical medications come in in order to help keep track of the instructions about where and how to use these. Pharmacies typically print the medication instructions only on the boxes and not directly on the medication tubes.   If your medication is too expensive, please contact our office at [phone] option 4 or send [place] a message through MyChart.   We are unable to tell what your co-pay for medications will be in advance as this is different depending on your insurance coverage. However, we may be able to find a substitute medication at lower cost or fill out paperwork to get insurance to cover a needed medication.   If a prior authorization is required to get your medication covered by your insurance company, please allow [place] 1-2 business days to complete this process.  Drug prices often vary depending on where the prescription is filled and some pharmacies may offer cheaper prices.  The website www.goodrx.com contains coupons for medications through different pharmacies. The prices here do not account for what the cost may be with help from insurance (it may be cheaper with your insurance), but the website can give you the price if you did not use any insurance.  - You can print the associated coupon and take it with your prescription to the pharmacy.  - You may also stop by our office during regular business hours and pick up a GoodRx coupon card.  - If you need your prescription sent electronically to a different pharmacy, notify our office through [facility] or by phone at [phone] option 4.     Si Usted Necesita Algo Despus de Su  Visita  Tambin puede enviarnos un mensaje a travs de Clinical cytogeneticist. Por lo general respondemos a los mensajes de MyChart en el transcurso de 1 a 2 das hbiles.  Para renovar recetas, por favor pida a su farmacia que se ponga en contacto con nuestra oficina. Annie Sable de fax es [place] [phone].  Si tiene un asunto urgente cuando la clnica est cerrada y que no puede esperar hasta el siguiente da hbil, puede llamar/localizar a su doctor(a) al nmero que aparece a continuacin.   Por favor, tenga en cuenta que aunque hacemos todo lo posible para estar disponibles para asuntos urgentes fuera del horario de [place], no estamos disponibles las 24 horas del da, los 7 [address]  [address] de la [place].   Si tiene un problema urgente y no puede comunicarse con nosotros, puede optar por buscar atencin mdica  en el consultorio de su doctor(a), en [place]  clnica privada, en un centro de atencin urgente o en una sala de emergencias.  Si tiene Engineer, drilling, por favor llame inmediatamente al 911 o vaya a la sala de emergencias.  Nmeros de bper  - Dr. Gwen Pounds: [phone]  - Dra. Roseanne Reno: [phone]  - Dr. Katrinka Blazing: [phone]   En caso de inclemencias del tiempo, por favor llame a Lacy Duverney principal al [phone] para una actualizacin sobre el [place] de cualquier retraso o cierre.  Consejos para la medicacin en dermatologa: Por favor, guarde las cajas en las que vienen los medicamentos de uso tpico para ayudarle a seguir las instrucciones sobre dnde y cmo usarlos. Las farmacias generalmente imprimen las instrucciones del medicamento slo en las cajas y no directamente en los tubos del [place].   Si su medicamento es muy caro, por favor, pngase en contacto con Rolm Gala llamando al [phone] y presione la opcin 4 o envenos un mensaje a travs de Clinical cytogeneticist.   No podemos decirle cul ser su copago por los medicamentos por adelantado ya que esto es diferente dependiendo de  la cobertura de su seguro. Sin embargo, es posible que podamos encontrar un medicamento sustituto a Audiological scientist un formulario para que el seguro cubra el medicamento que se considera necesario.   Si se requiere una autorizacin previa para que su compaa de seguros [place] su medicamento, por favor permtanos de 1 a 2 das hbiles para completar [address]  Los precios de los medicamentos varan con frecuencia dependiendo del Environmental consultant de dnde se surte la receta y alguna farmacias pueden ofrecer precios ms baratos.  El sitio web www.goodrx.com tiene cupones para medicamentos de Health and safety inspector. Los precios aqu no tienen en cuenta lo que podra costar con la ayuda del seguro (puede ser ms barato con su seguro), pero el sitio web puede darle el precio si no utiliz Tourist information centre manager.  - Puede imprimir el cupn correspondiente y llevarlo con su receta a la farmacia.  - Tambin puede pasar por nuestra oficina durante el horario de atencin regular y Education officer, museum una tarjeta de cupones de GoodRx.  - Si necesita que su receta se enve electrnicamente a una farmacia diferente, informe a nuestra oficina a travs de MyChart de  o por telfono llamando al [phone] y presione la opcin 4.

## 2023-09-11 NOTE — Progress Notes (Signed)
   New Patient Visit   Subjective  Adrienne Hall is a 37 y.o. female who presents for the following: Skin Cancer Screening and Full Body Skin Exam. Brother has melanoma, stage IV, metastatic. No personal Hx of skin cancer or dysplastic nevi. Few lesions under breast.   The patient presents for Total-Body Skin Exam (TBSE) for skin cancer screening and mole check. The patient has spots, moles and lesions to be evaluated, some may be new or changing and the patient may have concern these could be cancer.    The following portions of the chart were reviewed this encounter and updated as appropriate: medications, allergies, medical history  Review of Systems:  No other skin or systemic complaints except as noted in HPI or Assessment and Plan.  Objective  Well appearing patient in no apparent distress; mood and affect are within normal limits.  A full examination was performed including scalp, head, eyes, ears, nose, lips, neck, chest, axillae, abdomen, back, buttocks, bilateral upper extremities, bilateral lower extremities, hands, feet, fingers, toes, fingernails, and toenails. All findings within normal limits unless otherwise noted below.   Relevant physical exam findings are noted in the Assessment and Plan.  Exam of nails limited by presence of nail polish.   Assessment & Plan   FAMILY HISTORY OF SKIN CANCER What type(s): Melanoma, stage IV, metastatic, 04/13/2021 Who affected: Brother   Family Hx of breast cancer, multiple myeloma, soft tissue sarcoma.  SKIN CANCER SCREENING PERFORMED TODAY.   LENTIGINES, SEBORRHEIC KERATOSES,  - Benign normal skin lesions - Benign-appearing - Call for any changes  MELANOCYTIC NEVI - Tan-brown and/or pink-flesh-colored symmetric macules and papules - Benign appearing on exam today - Observation - Call clinic for new or changing moles - Recommend daily use of broad spectrum spf 30+ sunscreen to sun-exposed areas.  - Check nails when  remove polish.     Return for TBSE 1-2 years; Fm Hx MM.  I, Lawson Radar, CMA, am acting as scribe for Armida Sans, MD.   Documentation: I have reviewed the above documentation for accuracy and completeness, and I agree with the above.  Armida Sans, MD

## 2023-09-11 NOTE — Assessment & Plan Note (Signed)
Improving Continue wellbutrin 300 mg XL once daily  Continue prozac 40 mg once daily.  Discussed therapy, can consider FMLA for appointments if needed  Pt verbalized understanding and will let me know if this is something she decides to pursue.

## 2023-09-11 NOTE — Assessment & Plan Note (Signed)
Only prn , stopped taking daily.  No recent episodes only had initial outbreak then no more.  Valtrex prn

## 2023-10-30 ENCOUNTER — Ambulatory Visit: Payer: Managed Care, Other (non HMO) | Admitting: Family

## 2023-12-30 ENCOUNTER — Encounter: Payer: Self-pay | Admitting: Family

## 2024-01-02 NOTE — Telephone Encounter (Signed)
 Please schedule pt for CPE and we can get pregnancy test to start ocp. If she would like CPE can be with pap since it looks like she is overdue unless she has a gynecologist.

## 2024-02-18 ENCOUNTER — Ambulatory Visit (INDEPENDENT_AMBULATORY_CARE_PROVIDER_SITE_OTHER): Payer: Managed Care, Other (non HMO) | Admitting: Family

## 2024-02-18 ENCOUNTER — Other Ambulatory Visit (HOSPITAL_COMMUNITY)
Admission: RE | Admit: 2024-02-18 | Discharge: 2024-02-18 | Disposition: A | Discharge status: Home / Self Care | Payer: Managed Care, Other (non HMO) | Source: Ambulatory Visit | Attending: Family | Admitting: Family

## 2024-02-18 ENCOUNTER — Encounter: Payer: Self-pay | Admitting: Family

## 2024-02-18 VITALS — BP 110/68 | HR 62 | Temp 98.3°F | Ht 66.0 in | Wt 186.0 lb

## 2024-02-18 DIAGNOSIS — F4323 Adjustment disorder with mixed anxiety and depressed mood: Secondary | ICD-10-CM

## 2024-02-18 DIAGNOSIS — Z01419 Encounter for gynecological examination (general) (routine) without abnormal findings: Secondary | ICD-10-CM | POA: Insufficient documentation

## 2024-02-18 DIAGNOSIS — Z1272 Encounter for screening for malignant neoplasm of vagina: Secondary | ICD-10-CM | POA: Insufficient documentation

## 2024-02-18 DIAGNOSIS — Z202 Contact with and (suspected) exposure to infections with a predominantly sexual mode of transmission: Secondary | ICD-10-CM

## 2024-02-18 DIAGNOSIS — E785 Hyperlipidemia, unspecified: Secondary | ICD-10-CM | POA: Diagnosis not present

## 2024-02-18 DIAGNOSIS — Z124 Encounter for screening for malignant neoplasm of cervix: Secondary | ICD-10-CM | POA: Diagnosis present

## 2024-02-18 LAB — LIPID PANEL
Cholesterol: 174 mg/dL (ref 0–200)
HDL: 64.1 mg/dL (ref >39.00)
LDL Cholesterol: 93 mg/dL (ref 0–99)
NonHDL: 109.62
Total CHOL/HDL Ratio: 3
Triglycerides: 84 mg/dL (ref 0.0–149.0)
VLDL: 16.8 mg/dL (ref 0.0–40.0)

## 2024-02-18 LAB — BASIC METABOLIC PANEL
BUN: 10 mg/dL (ref 6–23)
CO2: 29 meq/L (ref 19–32)
Calcium: 9.2 mg/dL (ref 8.4–10.5)
Chloride: 105 meq/L (ref 96–112)
Creatinine, Ser: 0.79 mg/dL (ref 0.40–1.20)
GFR: 94.71 mL/min (ref >60.00)
Glucose, Bld: 88 mg/dL (ref 70–99)
Potassium: 4 meq/L (ref 3.5–5.1)
Sodium: 139 meq/L (ref 135–145)

## 2024-02-18 LAB — CBC
HCT: 40.2 % (ref 36.0–46.0)
Hemoglobin: 13.3 g/dL (ref 12.0–15.0)
MCHC: 33.2 g/dL (ref 30.0–36.0)
MCV: 94.4 fL (ref 78.0–100.0)
Platelets: 295 10*3/uL (ref 150.0–400.0)
RBC: 4.26 Mil/uL (ref 3.87–5.11)
RDW: 12.8 % (ref 11.5–15.5)
WBC: 4.8 10*3/uL (ref 4.0–10.5)

## 2024-02-18 LAB — TSH: TSH: 0.68 u[IU]/mL (ref 0.35–5.50)

## 2024-02-18 NOTE — Assessment & Plan Note (Signed)
 Reviewed most recent lipid panel.  ?Ordered lipid panel, pending results. Work on low cholesterol diet and exercise as tolerated ? ?

## 2024-02-18 NOTE — Assessment & Plan Note (Signed)
 Continue prozac 40 mg  Continue wellbutrin to 300 mg XL  Pt declines therapy at this time

## 2024-02-18 NOTE — Assessment & Plan Note (Signed)
 Pt verbalized consent for pelvic exam Declines std testing hpv thin prep ordered and pending results Pap exam in office completed, pt tolerated well.   Patient Counseling(The following topics were reviewed):  Preventative care handout given to pt  Health maintenance and immunizations reviewed. Please refer to Health maintenance section. Pt advised on safe sex, wearing seatbelts in car, and proper nutrition labwork ordered today for annual Dental health: Discussed importance of regular tooth brushing, flossing, and dental visits.

## 2024-02-18 NOTE — Progress Notes (Signed)
 Subjective:  [name], female    DOB: 1986/12/03  Age: 38 y.o. MRN: [id number]  Patient Care Team: Mort Sawyers, FNP as PCP - General (Family Medicine) Deirdre Evener, MD (Dermatology) Glendale Chard, DO as Consulting Physician (Neurology)   CC:  Chief Complaint  Patient presents with   Annual Exam    HPI Adrienne Hall is a 38 y.o. female who presents today for a well woman exam. She reports consuming a general diet. The patient does not participate in regular exercise at present. She generally feels well. She reports sleeping fairly well. She does not have additional problems to discuss today. Has tried otc meds like zquil at night time prn but states makes her really drowsy. Gets up around 3 am and goes to bed around 10/1129 will go back to sleep and then back up at five.    Vision:Within last year Dental:Receives regular dental care   Last pap: 01/09/2019  No h/o abn pap smears H/o hsv, has had chlamydia and gonorrhea which has been treated.   Pt is without acute concerns.  Ok to get tested for STDS   Advanced Directives Patient does not have advanced directives    DEPRESSION SCREENING    02/18/2024   11:12 AM 09/11/2023   11:01 AM 07/18/2023    1:17 PM 02/06/2023   10:52 AM 10/18/2022   11:02 AM 04/25/2022    8:33 AM 01/05/2022    8:57 AM  PHQ 2/9 Scores  PHQ - 2 Score 1 1 0 1 2 3 4   PHQ- 9 Score 3 3  9 13 10 13      ROS: Negative unless specifically indicated above in HPI.    Current Outpatient Medications:    buPROPion (WELLBUTRIN XL) 300 MG 24 hr tablet, Take 1 tablet (300 mg total) by mouth daily., Disp: 90 tablet, Rfl: 1   FLUoxetine (PROZAC) 40 MG capsule, Take 1 capsule (40 mg total) by mouth daily., Disp: 90 capsule, Rfl: 3   Multiple Vitamin (MULTIVITAMIN) tablet, Take 1 tablet by mouth daily., Disp: , Rfl:    valACYclovir (VALTREX) 1000 MG tablet, Take one po bid x 5 days prn outbreak, Disp: , Rfl:     Objective:    BP 110/68 (BP  Location: Right Arm, Patient Position: Sitting, Cuff Size: Normal)   Pulse 62   Temp 98.3 F (36.8 C) (Temporal)   Ht 5\' 6"  (1.676 m)   Wt 186 lb (84.4 kg)   LMP 02/04/2024 (Approximate)   SpO2 98%   BMI 30.02 kg/m   BP Readings from Last 3 Encounters:  02/18/24 110/68  07/18/23 106/68  02/06/23 120/84      @PHYSEXAMBYAGE @  Gen: NAD, resting comfortably Breasts: breasts appear normal, no suspicious masses, no skin or nipple changes or axillary nodes Physical Exam Genitourinary:    General: Normal vulva.     Pubic Area: No rash.      Labia:        Right: No rash, tenderness, lesion or injury.        Left: No rash, tenderness, lesion or injury.      Urethra: No prolapse or urethral pain.     Vagina: Normal. No vaginal discharge, tenderness or lesions.     Cervix: No discharge.     Rectum: Normal.  Psych: Normal affect and thought content    Last metabolic panel Lab Results  Component Value Date   GLUCOSE 70 02/06/2023   NA 137 02/06/2023  K 4.2 02/06/2023   CL 105 02/06/2023   CO2 28 02/06/2023   BUN 11 02/06/2023   CREATININE 0.90 02/06/2023   GFR 81.58 02/06/2023   CALCIUM 9.4 02/06/2023   PROT 7.1 02/06/2023   ALBUMIN 4.0 02/06/2023   BILITOT 0.5 02/06/2023   ALKPHOS 38 (L) 02/06/2023   AST 11 02/06/2023   ALT 14 02/06/2023   ANIONGAP 7 07/03/2022   Lab Results  Component Value Date   CHOL 207 (H) 04/25/2022   HDL 63.90 04/25/2022   LDLCALC 130 (H) 04/25/2022   TRIG 66.0 04/25/2022   CHOLHDL 3 04/25/2022       Assessment & Plan:  Cervical cancer screening -     Cytology - PAP  Encounter for Papanicolaou smear of vagina as part of routine gynecological examination Assessment & Plan: Pt verbalized consent for pelvic exam Declines std testing hpv thin prep ordered and pending results Pap exam in office completed, pt tolerated well.   Patient Counseling(The following topics were reviewed):  Preventative care handout given to pt  Health  maintenance and immunizations reviewed. Please refer to Health maintenance section. Pt advised on safe sex, wearing seatbelts in car, and proper nutrition labwork ordered today for annual Dental health: Discussed importance of regular tooth brushing, flossing, and dental visits.   Orders: -     Lipid panel -     Basic metabolic panel -     CBC -     TSH -     Cytology - PAP  Encounter for assessment of STD exposure  Elevated lipids Assessment & Plan: Reviewed most recent lipid panel.  Ordered lipid panel, pending results. Work on low cholesterol diet and exercise as tolerated   Orders: -     Lipid panel  Adjustment disorder with mixed anxiety and depressed mood Assessment & Plan: Continue prozac 40 mg  Continue wellbutrin to 300 mg XL  Pt declines therapy at this time       Follow-up: Return in about 1 year (around 02/17/2025) for f/u CPE.   Mort Sawyers, FNP

## 2024-02-20 LAB — CYTOLOGY - PAP
Adequacy: ABSENT
Comment: NEGATIVE
Diagnosis: NEGATIVE
High risk HPV: NEGATIVE

## 2024-02-21 ENCOUNTER — Encounter: Payer: Self-pay | Admitting: Family

## 2024-07-13 ENCOUNTER — Encounter: Payer: Self-pay | Admitting: Family

## 2024-07-13 DIAGNOSIS — Z3009 Encounter for other general counseling and advice on contraception: Secondary | ICD-10-CM

## 2024-09-08 NOTE — Progress Notes (Unsigned)
 Adrienne Antu, FNP   No chief complaint on file.   HPI:      [name] is a 38 y.o. 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) 02/06/2023   PTSD (post-traumatic stress disorder) 05/25/2022   Elevated lipids 04/25/2022   Adjustment disorder with mixed anxiety and depressed mood 01/24/2022   History of herpes genitalis 11/25/2015    Past Surgical History:  Procedure Laterality Date   CESAREAN SECTION     GALLBLADDER SURGERY     WISDOM TOOTH EXTRACTION      Family History  Problem Relation Age of Onset   Diabetes Maternal Grandmother    Hypertension Maternal Grandmother    Hyperlipidemia Maternal Grandmother    Alzheimer's disease Maternal Grandmother    Stroke Maternal Grandmother 98   Heart disease Maternal Grandfather    Hypertension Maternal Grandfather    Breast cancer Paternal Grandmother 66   Kidney disease Paternal Grandfather    Healthy Mother    Healthy Father    Diabetes Brother        associated with cancer   Melanoma Brother    Other Brother        soft tissue sarcoma; melanoma   Hypertension Brother    Hyperlipidemia Brother    Other Brother        hx brain tumor, was removed   Multiple sclerosis Brother    Multiple myeloma Maternal Aunt    Epilepsy Maternal Aunt    Breast cancer Maternal Great-grandmother 39    Social History   Socioeconomic History   Marital status: Single    Spouse name: Not on file   Number of children: 2   Years of education: college   Highest education level: Some college, no degree  Occupational History   Occupation: Designer, television/film set: Conservation officer, historic buildings   Occupation: [employer]    Comment: works with Community education officer for data entry, works from home  Tobacco Use   Smoking status: Never   Smokeless tobacco: Never  Vaping  Use   Vaping status: Every Day   Substances: Nicotine, Flavoring  Substance and Sexual Activity   Alcohol use: Yes    Alcohol/week: 1.0 standard drink of alcohol    Types: 1 Glasses of wine per week    Comment: once a week, glass of wine   Drug use: Not Currently    Types: Marijuana    Comment: used years ago   Sexual activity: Yes    Partners: Male    Birth control/protection: Implant  Other Topics Concern   Not on file  Social History Narrative   03/10/20   From: grew up here, most recently moved from [place]     Living: with two daughters   Work: Printmaker - data entry position      Family: Adrienne Hall (2012) and Adrienne Hall (2015)      Enjoys: reading and cleaning      Exercise: not currently   Diet: drinks a lot of water, too much caffeine       Safety   Seat belts: Yes    Guns: No   Safe in relationships: Yes       Right handed       Social Drivers of Corporate investment banker Strain: Low Risk  (  02/17/2024)   Overall Financial Resource Strain (CARDIA)    Difficulty of Paying Living Expenses: Not very hard  Food Insecurity: No Food Insecurity (02/17/2024)   Hunger Vital Sign    Worried About Running Out of Food in the Last Year: Never true    Ran Out of Food in the Last Year: Never true  Transportation Needs: No Transportation Needs (02/17/2024)   PRAPARE - Administrator, Civil Service (Medical): No    Lack of Transportation (Non-Medical): No  Physical Activity: Insufficiently Active (02/17/2024)   Exercise Vital Sign    Days of Exercise per Week: 2 days    Minutes of Exercise per Session: 10 min  Stress: Stress Concern Present (02/17/2024)   [employer] of Occupational Health - Occupational Stress Questionnaire    Feeling of Stress : To some extent  Social Connections: Moderately Isolated (02/17/2024)   Social Connection and Isolation Panel    Frequency of Communication with Friends and Family: Three times a week    Frequency of Social  Gatherings with Friends and Family: Never    Attends Religious Services: Never    Database administrator or Organizations: No    Attends Engineer, structural: Not on file    Marital Status: Living with partner  Intimate Partner Violence: Not At Risk (07/18/2023)   Humiliation, Afraid, Rape, and Kick questionnaire    Fear of Current or Ex-Partner: No    Emotionally Abused: No    Physically Abused: No    Sexually Abused: No    Outpatient Medications Prior to Visit  Medication Sig Dispense Refill   buPROPion  (WELLBUTRIN  XL) 300 MG 24 hr tablet Take 1 tablet (300 mg total) by mouth daily. 90 tablet 1   FLUoxetine  (PROZAC ) 40 MG capsule Take 1 capsule (40 mg total) by mouth daily. 90 capsule 3   Multiple Vitamin (MULTIVITAMIN) tablet Take 1 tablet by mouth daily.     valACYclovir  (VALTREX ) 1000 MG tablet Take one po bid x 5 days prn outbreak     No facility-administered medications prior to visit.      ROS:  Review of Systems BREAST: No symptoms   OBJECTIVE:   Vitals:  There were no vitals taken for this visit.  Physical Exam  Results: No results found for this or any previous visit (from the past 24 hours).   Assessment/Plan: No diagnosis found.    No orders of the defined types were placed in this encounter.     No follow-ups on file.  Oday Ridings B. Zaharah Amir, PA-C 09/08/2024 4:38 PM

## 2024-09-10 ENCOUNTER — Ambulatory Visit: Admitting: Obstetrics and Gynecology

## 2024-09-10 ENCOUNTER — Encounter: Payer: Self-pay | Admitting: Obstetrics and Gynecology

## 2024-09-10 VITALS — BP 119/82 | HR 65 | Ht 66.0 in | Wt 176.0 lb

## 2024-09-10 DIAGNOSIS — Z30017 Encounter for initial prescription of implantable subdermal contraceptive: Secondary | ICD-10-CM

## 2024-09-10 DIAGNOSIS — Z3009 Encounter for other general counseling and advice on contraception: Secondary | ICD-10-CM | POA: Diagnosis not present

## 2024-09-10 DIAGNOSIS — Z3202 Encounter for pregnancy test, result negative: Secondary | ICD-10-CM | POA: Diagnosis not present

## 2024-09-10 LAB — POCT URINE PREGNANCY: Preg Test, Ur: NEGATIVE

## 2024-09-10 MED ORDER — ETONOGESTREL 68 MG ~~LOC~~ IMPL
68.0000 mg | DRUG_IMPLANT | Freq: Once | SUBCUTANEOUS | Status: AC
  Administered 2024-09-10: 68 mg via SUBCUTANEOUS

## 2024-09-10 NOTE — Patient Instructions (Signed)
 I value your feedback and you entrusting [place] with your care. If you get a [place] patient survey, I would appreciate you taking the time to let us know about your experience today. Thank you! ? ? ?

## 2024-09-25 ENCOUNTER — Encounter: Payer: Self-pay | Admitting: Family

## 2024-09-25 DIAGNOSIS — F321 Major depressive disorder, single episode, moderate: Secondary | ICD-10-CM

## 2024-09-30 MED ORDER — FLUOXETINE HCL 40 MG PO CAPS: 40.0000 mg | ORAL_CAPSULE | Freq: Every day | ORAL | 1 refills | Status: AC

## 2025-09-15 ENCOUNTER — Ambulatory Visit: Payer: Managed Care, Other (non HMO) | Admitting: Dermatology
